# Patient Record
Sex: Female | Born: 1995 | Race: White | Marital: Single
Health system: Southern US, Community
[De-identification: ages and names within clinical notes are randomized; demographics above are authoritative.]

## PROBLEM LIST (undated history)

## (undated) DIAGNOSIS — D709 Neutropenia, unspecified: Secondary | ICD-10-CM

## (undated) DIAGNOSIS — K900 Celiac disease: Secondary | ICD-10-CM

## (undated) HISTORY — PX: NO PAST SURGERIES: SHX2092

## (undated) HISTORY — DX: Celiac disease: K90.0

---
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
## 2017-03-14 DIAGNOSIS — E860 Dehydration: Secondary | ICD-10-CM | POA: Diagnosis not present

## 2017-03-14 DIAGNOSIS — R55 Syncope and collapse: Secondary | ICD-10-CM | POA: Diagnosis not present

## 2017-03-14 DIAGNOSIS — I959 Hypotension, unspecified: Secondary | ICD-10-CM | POA: Diagnosis not present

## 2017-03-14 DIAGNOSIS — N946 Dysmenorrhea, unspecified: Secondary | ICD-10-CM | POA: Diagnosis not present

## 2017-03-14 DIAGNOSIS — R001 Bradycardia, unspecified: Secondary | ICD-10-CM | POA: Diagnosis not present

## 2017-03-21 DIAGNOSIS — Z6821 Body mass index (BMI) 21.0-21.9, adult: Secondary | ICD-10-CM | POA: Diagnosis not present

## 2017-03-21 DIAGNOSIS — Z0000 Encounter for general adult medical examination without abnormal findings: Secondary | ICD-10-CM | POA: Diagnosis not present

## 2017-03-21 DIAGNOSIS — Z1389 Encounter for screening for other disorder: Secondary | ICD-10-CM | POA: Diagnosis not present

## 2017-08-02 DIAGNOSIS — D649 Anemia, unspecified: Secondary | ICD-10-CM | POA: Diagnosis not present

## 2017-08-02 DIAGNOSIS — R7309 Other abnormal glucose: Secondary | ICD-10-CM | POA: Diagnosis not present

## 2017-08-02 DIAGNOSIS — D513 Other dietary vitamin B12 deficiency anemia: Secondary | ICD-10-CM | POA: Diagnosis not present

## 2017-08-02 DIAGNOSIS — R55 Syncope and collapse: Secondary | ICD-10-CM | POA: Diagnosis not present

## 2017-08-02 DIAGNOSIS — R5383 Other fatigue: Secondary | ICD-10-CM | POA: Diagnosis not present

## 2017-08-02 DIAGNOSIS — R233 Spontaneous ecchymoses: Secondary | ICD-10-CM | POA: Diagnosis not present

## 2017-08-02 DIAGNOSIS — D72819 Decreased white blood cell count, unspecified: Secondary | ICD-10-CM | POA: Diagnosis not present

## 2017-08-21 DIAGNOSIS — Z713 Dietary counseling and surveillance: Secondary | ICD-10-CM | POA: Diagnosis not present

## 2017-08-30 DIAGNOSIS — D72819 Decreased white blood cell count, unspecified: Secondary | ICD-10-CM | POA: Diagnosis not present

## 2017-09-22 DIAGNOSIS — Z713 Dietary counseling and surveillance: Secondary | ICD-10-CM | POA: Diagnosis not present

## 2017-10-20 DIAGNOSIS — Z713 Dietary counseling and surveillance: Secondary | ICD-10-CM | POA: Diagnosis not present

## 2017-10-24 DIAGNOSIS — H5211 Myopia, right eye: Secondary | ICD-10-CM | POA: Diagnosis not present

## 2017-11-17 DIAGNOSIS — Z713 Dietary counseling and surveillance: Secondary | ICD-10-CM | POA: Diagnosis not present

## 2017-12-22 DIAGNOSIS — Z713 Dietary counseling and surveillance: Secondary | ICD-10-CM | POA: Diagnosis not present

## 2018-01-29 DIAGNOSIS — Z713 Dietary counseling and surveillance: Secondary | ICD-10-CM | POA: Diagnosis not present

## 2018-02-26 DIAGNOSIS — Z713 Dietary counseling and surveillance: Secondary | ICD-10-CM | POA: Diagnosis not present

## 2018-04-06 DIAGNOSIS — Z124 Encounter for screening for malignant neoplasm of cervix: Secondary | ICD-10-CM | POA: Diagnosis not present

## 2018-04-06 DIAGNOSIS — N926 Irregular menstruation, unspecified: Secondary | ICD-10-CM | POA: Diagnosis not present

## 2018-04-06 DIAGNOSIS — Z01419 Encounter for gynecological examination (general) (routine) without abnormal findings: Secondary | ICD-10-CM | POA: Diagnosis not present

## 2018-04-06 DIAGNOSIS — N920 Excessive and frequent menstruation with regular cycle: Secondary | ICD-10-CM | POA: Diagnosis not present

## 2018-04-06 DIAGNOSIS — Z130 Encounter for screening for diseases of the blood and blood-forming organs and certain disorders involving the immune mechanism: Secondary | ICD-10-CM | POA: Diagnosis not present

## 2018-04-09 DIAGNOSIS — Z124 Encounter for screening for malignant neoplasm of cervix: Secondary | ICD-10-CM | POA: Diagnosis not present

## 2018-04-18 DIAGNOSIS — Z713 Dietary counseling and surveillance: Secondary | ICD-10-CM | POA: Diagnosis not present

## 2018-07-12 DIAGNOSIS — D2272 Melanocytic nevi of left lower limb, including hip: Secondary | ICD-10-CM | POA: Diagnosis not present

## 2018-07-12 DIAGNOSIS — D2271 Melanocytic nevi of right lower limb, including hip: Secondary | ICD-10-CM | POA: Diagnosis not present

## 2018-07-12 DIAGNOSIS — L84 Corns and callosities: Secondary | ICD-10-CM | POA: Diagnosis not present

## 2018-08-02 DIAGNOSIS — D2371 Other benign neoplasm of skin of right lower limb, including hip: Secondary | ICD-10-CM | POA: Diagnosis not present

## 2018-08-02 DIAGNOSIS — D2372 Other benign neoplasm of skin of left lower limb, including hip: Secondary | ICD-10-CM | POA: Diagnosis not present

## 2018-08-08 DIAGNOSIS — E7849 Other hyperlipidemia: Secondary | ICD-10-CM | POA: Diagnosis not present

## 2018-08-08 DIAGNOSIS — D649 Anemia, unspecified: Secondary | ICD-10-CM | POA: Diagnosis not present

## 2018-08-08 DIAGNOSIS — Z0000 Encounter for general adult medical examination without abnormal findings: Secondary | ICD-10-CM | POA: Diagnosis not present

## 2018-08-13 DIAGNOSIS — R82998 Other abnormal findings in urine: Secondary | ICD-10-CM | POA: Diagnosis not present

## 2018-08-15 DIAGNOSIS — Z0000 Encounter for general adult medical examination without abnormal findings: Secondary | ICD-10-CM | POA: Diagnosis not present

## 2018-08-21 DIAGNOSIS — D2372 Other benign neoplasm of skin of left lower limb, including hip: Secondary | ICD-10-CM | POA: Diagnosis not present

## 2018-08-21 DIAGNOSIS — D2371 Other benign neoplasm of skin of right lower limb, including hip: Secondary | ICD-10-CM | POA: Diagnosis not present

## 2018-09-04 DIAGNOSIS — D2372 Other benign neoplasm of skin of left lower limb, including hip: Secondary | ICD-10-CM | POA: Diagnosis not present

## 2018-09-04 DIAGNOSIS — D2371 Other benign neoplasm of skin of right lower limb, including hip: Secondary | ICD-10-CM | POA: Diagnosis not present

## 2018-09-12 DIAGNOSIS — D2371 Other benign neoplasm of skin of right lower limb, including hip: Secondary | ICD-10-CM | POA: Diagnosis not present

## 2018-09-12 DIAGNOSIS — D2372 Other benign neoplasm of skin of left lower limb, including hip: Secondary | ICD-10-CM | POA: Diagnosis not present

## 2018-09-26 DIAGNOSIS — D2372 Other benign neoplasm of skin of left lower limb, including hip: Secondary | ICD-10-CM | POA: Diagnosis not present

## 2018-09-26 DIAGNOSIS — D2371 Other benign neoplasm of skin of right lower limb, including hip: Secondary | ICD-10-CM | POA: Diagnosis not present

## 2018-10-10 DIAGNOSIS — D2371 Other benign neoplasm of skin of right lower limb, including hip: Secondary | ICD-10-CM | POA: Diagnosis not present

## 2018-10-10 DIAGNOSIS — D2372 Other benign neoplasm of skin of left lower limb, including hip: Secondary | ICD-10-CM | POA: Diagnosis not present

## 2018-11-16 DIAGNOSIS — Z23 Encounter for immunization: Secondary | ICD-10-CM | POA: Diagnosis not present

## 2019-05-02 DIAGNOSIS — Z23 Encounter for immunization: Secondary | ICD-10-CM | POA: Diagnosis not present

## 2019-05-30 DIAGNOSIS — Z23 Encounter for immunization: Secondary | ICD-10-CM | POA: Diagnosis not present

## 2019-06-06 DIAGNOSIS — Z130 Encounter for screening for diseases of the blood and blood-forming organs and certain disorders involving the immune mechanism: Secondary | ICD-10-CM | POA: Diagnosis not present

## 2019-06-06 DIAGNOSIS — Z1389 Encounter for screening for other disorder: Secondary | ICD-10-CM | POA: Diagnosis not present

## 2019-06-06 DIAGNOSIS — Z01419 Encounter for gynecological examination (general) (routine) without abnormal findings: Secondary | ICD-10-CM | POA: Diagnosis not present

## 2019-06-06 DIAGNOSIS — Z309 Encounter for contraceptive management, unspecified: Secondary | ICD-10-CM | POA: Diagnosis not present

## 2019-06-18 DIAGNOSIS — Z3202 Encounter for pregnancy test, result negative: Secondary | ICD-10-CM | POA: Diagnosis not present

## 2019-06-18 DIAGNOSIS — Z30430 Encounter for insertion of intrauterine contraceptive device: Secondary | ICD-10-CM | POA: Diagnosis not present

## 2019-08-15 DIAGNOSIS — Z30431 Encounter for routine checking of intrauterine contraceptive device: Secondary | ICD-10-CM | POA: Diagnosis not present

## 2019-10-07 DIAGNOSIS — E785 Hyperlipidemia, unspecified: Secondary | ICD-10-CM | POA: Diagnosis not present

## 2019-10-07 DIAGNOSIS — Z0000 Encounter for general adult medical examination without abnormal findings: Secondary | ICD-10-CM | POA: Diagnosis not present

## 2019-12-26 DIAGNOSIS — Z30431 Encounter for routine checking of intrauterine contraceptive device: Secondary | ICD-10-CM | POA: Diagnosis not present

## 2020-01-01 DIAGNOSIS — J029 Acute pharyngitis, unspecified: Secondary | ICD-10-CM | POA: Diagnosis not present

## 2020-01-01 DIAGNOSIS — J069 Acute upper respiratory infection, unspecified: Secondary | ICD-10-CM | POA: Diagnosis not present

## 2020-01-03 ENCOUNTER — Other Ambulatory Visit: Payer: Self-pay

## 2020-01-03 ENCOUNTER — Ambulatory Visit
Admission: EM | Admit: 2020-01-03 | Discharge: 2020-01-03 | Disposition: A | Discharge status: Home / Self Care | Payer: BC Managed Care – PPO | Attending: Emergency Medicine | Admitting: Emergency Medicine

## 2020-01-03 DIAGNOSIS — J069 Acute upper respiratory infection, unspecified: Secondary | ICD-10-CM | POA: Diagnosis not present

## 2020-01-03 MED ORDER — BENZONATATE 100 MG PO CAPS: 100.0000 mg | ORAL_CAPSULE | Freq: Three times a day (TID) | ORAL | 0 refills | Status: DC | PRN

## 2020-01-03 NOTE — ED Provider Notes (Signed)
[facility]   [id number] 01/03/20 Arrival Time: 1923   CC: COVID symptoms  SUBJECTIVE: History from: patient.  Debra Henry is a 24 y.o. female who presented to the urgent care for complaint of sore throat and cough for the past few days was seen in urgent care has a negative strep, Covid.  Denies sick exposure to COVID, strep.  Denies recent travel.  Has tried OTC medication with no relief.  Denies aggravating factors.  Denies previous symptoms in the past.   Denies fever, chills, fatigue, sinus pain, rhinorrhea, sore throat, SOB, wheezing, chest pain, nausea, changes in bowel or bladder habits.     ROS: As per HPI.  All other pertinent ROS negative.     No past medical history on file. No past surgical history on file. No Known Allergies No current facility-administered medications on file prior to encounter.   Current Outpatient Medications on File Prior to Encounter  Medication Sig Dispense Refill  . Clindamycin-Benzoyl Per, Refr, gel      Social History   Socioeconomic History  . Marital status: Single    Spouse name: Not on file  . Number of children: Not on file  . Years of education: Not on file  . Highest education level: Not on file  Occupational History  . Not on file  Tobacco Use  . Smoking status: Never Smoker  . Smokeless tobacco: Not on file  Substance and Sexual Activity  . Alcohol use: Not on file  . Drug use: Not on file  . Sexual activity: Not on file  Other Topics Concern  . Not on file  Social History Narrative  . Not on file   Social Determinants of Health   Financial Resource Strain: Not on file  Food Insecurity: Not on file  Transportation Needs: Not on file  Physical Activity: Not on file  Stress: Not on file  Social Connections: Not on file  Intimate Partner Violence: Not on file   Family History  Problem Relation Age of Onset  . Sudden death Neg Hx   . Hypertension Neg Hx   . Hyperlipidemia Neg Hx   . Heart attack  Neg Hx   . Diabetes Neg Hx     OBJECTIVE:  Vitals:   01/03/20 1940  BP: 109/74  Pulse: 73  Resp: 20  Temp: 98.6 F (37 C)  SpO2: 98%     General appearance: alert; appears fatigued, but nontoxic; speaking in full sentences and tolerating own secretions HEENT: NCAT; Ears: EACs clear, TMs pearly gray; Eyes: PERRL.  EOM grossly intact. Sinuses: nontender; Nose: nares patent without rhinorrhea, Throat: oropharynx clear, tonsils non erythematous or enlarged, uvula midline  Neck: supple without LAD Lungs: unlabored respirations, symmetrical air entry; cough: moderate; no respiratory distress; CTAB Heart: regular rate and rhythm.  Radial pulses 2+ symmetrical bilaterally Skin: warm and dry Psychological: alert and cooperative; normal mood and affect  LABS:  No results found for this or any previous visit (from the past 24 hour(s)).   ASSESSMENT & PLAN:  1. Viral URI with cough     Meds ordered this encounter  Medications  . benzonatate (TESSALON) 100 MG capsule    Sig: Take 1 capsule (100 mg total) by mouth 3 (three) times daily as needed for cough.    Dispense:  30 capsule    Refill:  0    Discharge instructions  Get plenty of rest and push fluids Tessalon Perles prescribed for cough Continue to use Bromfed as prescribed  Continue to use lidocaine mouthwash as prescribed and directed for sore throat Use medications daily for symptom relief Use OTC medications like ibuprofen or tylenol as needed fever or pain Call or go to the ED if you have any new or worsening symptoms such as fever, worsening cough, shortness of breath, chest tightness, chest pain, turning blue, changes in mental status, etc...   Reviewed expectations re: course of current medical issues. Questions answered. Outlined signs and symptoms indicating need for more acute intervention. Patient verbalized understanding. After Visit Summary given.         Durward Parcel, FNP 01/03/20 1959

## 2020-01-03 NOTE — ED Triage Notes (Signed)
Pt presents with sore throat and cough, seen recently [facility] and tested negative for strep and covid

## 2020-01-03 NOTE — Discharge Instructions (Addendum)
Get plenty of rest and push fluids Tessalon Perles prescribed for cough Continue to use Bromfed as prescribed Continue to use lidocaine mouthwash as prescribed and directed for sore throat Use medications daily for symptom relief Use OTC medications like ibuprofen or tylenol as needed fever or pain Call or go to the ED if you have any new or worsening symptoms such as fever, worsening cough, shortness of breath, chest tightness, chest pain, turning blue, changes in mental status, etc..Marland Kitchen

## 2020-01-18 HISTORY — PX: BONE MARROW BIOPSY: SHX199

## 2020-02-28 DIAGNOSIS — Z20822 Contact with and (suspected) exposure to covid-19: Secondary | ICD-10-CM | POA: Diagnosis not present

## 2020-05-14 DIAGNOSIS — R102 Pelvic and perineal pain: Secondary | ICD-10-CM | POA: Diagnosis not present

## 2020-05-14 DIAGNOSIS — Z113 Encounter for screening for infections with a predominantly sexual mode of transmission: Secondary | ICD-10-CM | POA: Diagnosis not present

## 2020-05-14 DIAGNOSIS — R350 Frequency of micturition: Secondary | ICD-10-CM | POA: Diagnosis not present

## 2020-05-15 DIAGNOSIS — R102 Pelvic and perineal pain: Secondary | ICD-10-CM | POA: Diagnosis not present

## 2020-05-30 DIAGNOSIS — Z03818 Encounter for observation for suspected exposure to other biological agents ruled out: Secondary | ICD-10-CM | POA: Diagnosis not present

## 2020-05-30 DIAGNOSIS — Z20822 Contact with and (suspected) exposure to covid-19: Secondary | ICD-10-CM | POA: Diagnosis not present

## 2020-06-09 DIAGNOSIS — Z6822 Body mass index (BMI) 22.0-22.9, adult: Secondary | ICD-10-CM | POA: Diagnosis not present

## 2020-06-09 DIAGNOSIS — Z130 Encounter for screening for diseases of the blood and blood-forming organs and certain disorders involving the immune mechanism: Secondary | ICD-10-CM | POA: Diagnosis not present

## 2020-06-09 DIAGNOSIS — Z01419 Encounter for gynecological examination (general) (routine) without abnormal findings: Secondary | ICD-10-CM | POA: Diagnosis not present

## 2020-06-09 DIAGNOSIS — Z1389 Encounter for screening for other disorder: Secondary | ICD-10-CM | POA: Diagnosis not present

## 2020-06-12 DIAGNOSIS — R1030 Lower abdominal pain, unspecified: Secondary | ICD-10-CM | POA: Diagnosis not present

## 2020-06-12 DIAGNOSIS — R82998 Other abnormal findings in urine: Secondary | ICD-10-CM | POA: Diagnosis not present

## 2020-06-12 DIAGNOSIS — Z0000 Encounter for general adult medical examination without abnormal findings: Secondary | ICD-10-CM | POA: Diagnosis not present

## 2020-06-19 DIAGNOSIS — E785 Hyperlipidemia, unspecified: Secondary | ICD-10-CM | POA: Diagnosis not present

## 2020-06-24 ENCOUNTER — Telehealth: Payer: Self-pay | Admitting: Hematology and Oncology

## 2020-06-24 NOTE — Telephone Encounter (Signed)
Scheduled appt per 6/7 referral. Pt aware.  

## 2020-07-02 ENCOUNTER — Encounter: Payer: Self-pay | Admitting: Hematology and Oncology

## 2020-07-02 ENCOUNTER — Inpatient Hospital Stay: Payer: BC Managed Care – PPO | Attending: Hematology and Oncology | Admitting: Hematology and Oncology

## 2020-07-02 ENCOUNTER — Telehealth: Payer: Self-pay | Admitting: *Deleted

## 2020-07-02 ENCOUNTER — Inpatient Hospital Stay: Payer: BC Managed Care – PPO

## 2020-07-02 ENCOUNTER — Other Ambulatory Visit: Payer: Self-pay

## 2020-07-02 VITALS — BP 106/72 | HR 63 | Temp 97.3°F | Resp 17 | Ht 64.0 in | Wt 139.6 lb

## 2020-07-02 DIAGNOSIS — D709 Neutropenia, unspecified: Secondary | ICD-10-CM

## 2020-07-02 DIAGNOSIS — D72819 Decreased white blood cell count, unspecified: Secondary | ICD-10-CM | POA: Diagnosis not present

## 2020-07-02 DIAGNOSIS — Z8042 Family history of malignant neoplasm of prostate: Secondary | ICD-10-CM | POA: Insufficient documentation

## 2020-07-02 DIAGNOSIS — Z79899 Other long term (current) drug therapy: Secondary | ICD-10-CM | POA: Diagnosis not present

## 2020-07-02 DIAGNOSIS — Z803 Family history of malignant neoplasm of breast: Secondary | ICD-10-CM | POA: Insufficient documentation

## 2020-07-02 LAB — HEPATITIS C ANTIBODY: HCV Ab: NONREACTIVE

## 2020-07-02 LAB — CBC WITH DIFFERENTIAL (CANCER CENTER ONLY)
Abs Immature Granulocytes: 0 10*3/uL (ref 0.00–0.07)
Basophils Absolute: 0 10*3/uL (ref 0.0–0.1)
Basophils Relative: 1 %
Eosinophils Absolute: 0 10*3/uL (ref 0.0–0.5)
Eosinophils Relative: 2 %
HCT: 39.2 % (ref 36.0–46.0)
Hemoglobin: 13.3 g/dL (ref 12.0–15.0)
Immature Granulocytes: 0 %
Lymphocytes Relative: 74 %
Lymphs Abs: 1.5 10*3/uL (ref 0.7–4.0)
MCH: 29.6 pg (ref 26.0–34.0)
MCHC: 33.9 g/dL (ref 30.0–36.0)
MCV: 87.1 fL (ref 80.0–100.0)
Monocytes Absolute: 0.4 10*3/uL (ref 0.1–1.0)
Monocytes Relative: 18 %
Neutro Abs: 0.1 10*3/uL — CL (ref 1.7–7.7)
Neutrophils Relative %: 5 %
Platelet Count: 253 10*3/uL (ref 150–400)
RBC: 4.5 MIL/uL (ref 3.87–5.11)
RDW: 12.6 % (ref 11.5–15.5)
WBC Count: 2 10*3/uL — ABNORMAL LOW (ref 4.0–10.5)
nRBC: 0 % (ref 0.0–0.2)

## 2020-07-02 LAB — CMP (CANCER CENTER ONLY)
ALT: 8 U/L (ref 0–44)
AST: 17 U/L (ref 15–41)
Albumin: 4.6 g/dL (ref 3.5–5.0)
Alkaline Phosphatase: 87 U/L (ref 38–126)
Anion gap: 7 (ref 5–15)
BUN: 18 mg/dL (ref 6–20)
CO2: 24 mmol/L (ref 22–32)
Calcium: 9.7 mg/dL (ref 8.9–10.3)
Chloride: 106 mmol/L (ref 98–111)
Creatinine: 0.93 mg/dL (ref 0.44–1.00)
GFR, Estimated: >60 mL/min (ref >60)
Glucose, Bld: 83 mg/dL (ref 70–99)
Potassium: 4.1 mmol/L (ref 3.5–5.1)
Sodium: 137 mmol/L (ref 135–145)
Total Bilirubin: 0.7 mg/dL (ref 0.3–1.2)
Total Protein: 7.5 g/dL (ref 6.5–8.1)

## 2020-07-02 LAB — HEPATITIS B SURFACE ANTIBODY,QUALITATIVE: Hep B S Ab: REACTIVE — AB

## 2020-07-02 LAB — VITAMIN B12: Vitamin B-12: 207 pg/mL (ref 180–914)

## 2020-07-02 LAB — LACTATE DEHYDROGENASE: LDH: 146 U/L (ref 98–192)

## 2020-07-02 LAB — FOLATE: Folate: 10.7 ng/mL (ref >5.9)

## 2020-07-02 LAB — SAVE SMEAR(SSMR), FOR PROVIDER SLIDE REVIEW

## 2020-07-02 LAB — HIV ANTIBODY (ROUTINE TESTING W REFLEX): HIV Screen 4th Generation wRfx: NONREACTIVE

## 2020-07-02 LAB — HEPATITIS B SURFACE ANTIGEN: Hepatitis B Surface Ag: NONREACTIVE

## 2020-07-02 LAB — HEPATITIS B CORE ANTIBODY, TOTAL: Hep B Core Total Ab: NONREACTIVE

## 2020-07-02 NOTE — Progress Notes (Signed)
[place] Telephone:([phone]) [phone]   Fax:([phone]) [place] NOTE  Patient Care Team: Pa, [facility] as PCP - General  Hematological/Oncological History # Leukopenia 10/07/2019: WBC 3.6, Hgb 12.4, MCV 84.9, Plt 246. ANC 1.7 06/12/2020: WBC 2.3, Hgb 13.4, MCV 90.2, Plt 250, ANC 0.2 06/19/2020: WBC 2.14, Hgb 13.6, MCV 89.3, Plt 237, ANC 0.2 07/02/2020: establish care with Dr. Lorenso Courier   CHIEF COMPLAINTS/PURPOSE OF CONSULTATION:  "Leukopenia "  HISTORY OF PRESENTING ILLNESS:  Debra Henry 25 y.o. female with no significant past medical history who presents for evaluation of leukopenia.   On review of the previous records Debra Henry has had a severe neutropenia since at least 06/12/2020.  At that time showed a white blood cell count of 2.3 with an ANC of 0.2.  Her hemoglobin MCV and platelets were all normal at that time.  On repeat check on 06/19/2020 the patient similarly had an ANC of 0.2.  Her last prior normal CBC was on 10/07/2019 which showed a white blood cell count 3.6 with an ANC of 1.7.  Due to concern for this patient's severe neutropenia she was referred to hematology for further evaluation and management.  On exam today Debra Henry is accompanied by her mother.  She reports that she had 2 UTIs "back-to-back" right before her graduation on May 14.  She notes that she has not been having any other clear signs of infection such as sinus infections, pneumonias, rashes, or nausea/vomiting/diarrhea.  She reports that she has had some stomach discomfort and pain that she believed was OB/GYN related.  Her OB/GYN thought this may be secondary to constipation, though she reports the pains are very similar to what she experienced when her IUD was placed last summer.  She reports that she is not taking any medications by mouth or supplements at this time.  She is otherwise quite healthy.  On further discussion she reports that she has never had any  surgeries performed.  Her mom and dad are both healthy though she does have a family history of breast cancer in her maternal grandmother and cancer in the shoulder of her maternal grandfather.  Her maternal uncle also had prostate cancer and she had a cousin with lymphoma.  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  She notes that her energy is good and she has excellent exercise tolerance.  She skips meals frequently but when she does eat has no restrictions and only tends to restrict calories.  A full 10 point ROS is listed below.  MEDICAL HISTORY:  History reviewed. No pertinent past medical history.  SURGICAL HISTORY: History reviewed. No pertinent surgical history.  SOCIAL HISTORY: Social History   Socioeconomic History   Marital status: Single    Spouse name: Not on file   Number of children: 0   Years of education: Not on file   Highest education level: Not on file  Occupational History   Not on file  Tobacco Use   Smoking status: Never   Smokeless tobacco: Never  Vaping Use   Vaping Use: Never used  Substance and Sexual Activity   Alcohol use: Never   Drug use: Not on file   Sexual activity: Not on file  Other Topics Concern   Not on file  Social History Narrative   Not on file   Social Determinants of Health   Financial Resource Strain: Not on file  Food Insecurity: Not on file  Transportation Needs: Not on file  Physical Activity: Not on file  Stress: Not on file  Social Connections: Not on file  Intimate Partner Violence: Not on file    FAMILY HISTORY: Family History  Problem Relation Age of Onset   Healthy Mother    Healthy Father    Prostate cancer Paternal Uncle    Breast cancer Paternal Grandmother    Sudden death Neg Hx    Hypertension Neg Hx    Hyperlipidemia Neg Hx    Heart attack Neg Hx    Diabetes Neg Hx     ALLERGIES:  has No Known Allergies.  MEDICATIONS:  Current Outpatient Medications  Medication  Sig Dispense Refill   benzonatate (TESSALON) 100 MG capsule Take 1 capsule (100 mg total) by mouth 3 (three) times daily as needed for cough. (Patient not taking: Reported on 07/02/2020) 30 capsule 0   Clindamycin-Benzoyl Per, Refr, gel  (Patient not taking: Reported on 07/02/2020)     levonorgestrel (KYLEENA) 19.5 MG IUD Kyleena 17.5 mcg/24 hrs (41yr) 19.566mintrauterine device  Take 1 device by intrauterine route.     No current facility-administered medications for this visit.    REVIEW OF SYSTEMS:   Constitutional: ( - ) fevers, ( - )  chills , ( - ) night sweats Eyes: ( - ) blurriness of vision, ( - ) double vision, ( - ) watery eyes Ears, nose, mouth, throat, and face: ( - ) mucositis, ( - ) sore throat Respiratory: ( - ) cough, ( - ) dyspnea, ( - ) wheezes Cardiovascular: ( - ) palpitation, ( - ) chest discomfort, ( - ) lower extremity swelling Gastrointestinal:  ( - ) nausea, ( - ) heartburn, ( - ) change in bowel habits Skin: ( - ) abnormal skin rashes Lymphatics: ( - ) new lymphadenopathy, ( - ) easy bruising Neurological: ( - ) numbness, ( - ) tingling, ( - ) new weaknesses Behavioral/Psych: ( - ) mood change, ( - ) new changes  All other systems were reviewed with the patient and are negative.  PHYSICAL EXAMINATION:  Vitals:   07/02/20 0911  BP: 106/72  Pulse: 63  Resp: 17  Temp: (!) 97.3 F (36.3 C)  SpO2: 99%   Filed Weights   07/02/20 0911  Weight: 139 lb 9.6 oz (63.3 kg)    GENERAL: well appearing young Caucasian female in NAD  SKIN: skin color, texture, turgor are normal, no rashes or significant lesions EYES: conjunctiva are pink and non-injected, sclera clear OROPHARYNX: no exudate, no erythema; lips, buccal mucosa, and tongue normal  NECK: supple, non-tender LYMPH:  no palpable lymphadenopathy in the cervical, axillary or supraclavicular lymph nodes.  LUNGS: clear to auscultation and percussion with normal breathing effort HEART: regular rate & rhythm  and no murmurs and no lower extremity edema ABDOMEN: soft, non-tender, non-distended, normal bowel sounds Musculoskeletal: no cyanosis of digits and no clubbing. No HSM appreciated.   PSYCH: alert & oriented x 3, fluent speech NEURO: no focal motor/sensory deficits  LABORATORY DATA:  I have reviewed the data as listed CBC Latest Ref Rng & Units 07/02/2020  WBC 4.0 - 10.5 K/uL 2.0(L)  Hemoglobin 12.0 - 15.0 g/dL 13.3  Hematocrit 36.0 - 46.0 % 39.2  Platelets 150 - 400 K/uL 253    CMP Latest Ref Rng & Units 07/02/2020  Glucose 70 - 99 mg/dL 83  BUN 6 - 20 mg/dL 18  Creatinine 0.44 - 1.00 mg/dL 0.93  Sodium 135 - 145  mmol/L 137  Potassium 3.5 - 5.1 mmol/L 4.1  Chloride 98 - 111 mmol/L 106  CO2 22 - 32 mmol/L 24  Calcium 8.9 - 10.3 mg/dL 9.7  Total Protein 6.5 - 8.1 g/dL 7.5  Total Bilirubin 0.3 - 1.2 mg/dL 0.7  Alkaline Phos 38 - 126 U/L 87  AST 15 - 41 U/L 17  ALT 0 - 44 U/L 8     RADIOGRAPHIC STUDIES:  No results found.  ASSESSMENT & PLAN Kiyanna Biegler 25 y.o. female with no significant past medical history who presents for evaluation of leukopenia.   After review of the labs, review of the records, and discussion with the patient the patients findings are most consistent with a severe neutropenia of unclear etiology.  The patient has no focal symptoms which would lead [place] towards a possible cause.  Also the patient has had perfectly normal hemoglobin, platelet count, and MCV throughout this whole process.  She is virtually asymptomatic with exception of 2 urinary tract infections earlier this year.  At this time we will do a broad work-up to include viral studies, nutritional studies, and review of the peripheral blood film.  If no clear etiology can be discerned we will need to consider bone marrow biopsy in order to further evaluate.  # Leukopenia # Severe Neutropenia -- Viral work-up with HIV, hepatitis B, hepatitis C --Nutritional work-up with vitamin B12, folate,  homocystine, and methylmalonic acid. --We will also order baseline CBC and CMP.  We will order LDH and review of peripheral blood film --If no clear etiology can be discerned we will need to pursue a bone marrow biopsy. --Return to clinic pending the results of the above studies.  Orders Placed This Encounter  Procedures   CBC with Differential ([facility] Only)    Standing Status:   Future    Number of Occurrences:   1    Standing Expiration Date:   07/02/2021   CMP (Cancer Center only)    Standing Status:   Future    Number of Occurrences:   1    Standing Expiration Date:   07/02/2021   Lactate dehydrogenase (LDH)    Standing Status:   Future    Number of Occurrences:   1    Standing Expiration Date:   07/02/2021   Vitamin B12    Standing Status:   Future    Number of Occurrences:   1    Standing Expiration Date:   07/02/2021   Folate, Serum    Standing Status:   Future    Number of Occurrences:   1    Standing Expiration Date:   07/02/2021   Methylmalonic acid, serum    Standing Status:   Future    Number of Occurrences:   1    Standing Expiration Date:   07/02/2021   Homocysteine, serum    Standing Status:   Future    Number of Occurrences:   1    Standing Expiration Date:   07/02/2021   Save Smear (SSMR)    Standing Status:   Future    Number of Occurrences:   1    Standing Expiration Date:   07/02/2021   Hepatitis B surface antigen    Standing Status:   Future    Number of Occurrences:   1    Standing Expiration Date:   07/02/2021   Hepatitis C antibody    Standing Status:   Future    Number of Occurrences:   1  Standing Expiration Date:   07/02/2021   HIV antibody (with reflex)    Standing Status:   Future    Number of Occurrences:   1    Standing Expiration Date:   07/02/2021   Hepatitis B core antibody, total    Standing Status:   Future    Number of Occurrences:   1    Standing Expiration Date:   07/02/2021   Hepatitis B surface antibody    Standing Status:    Future    Number of Occurrences:   1    Standing Expiration Date:   07/02/2021    All questions were answered. The patient knows to call the clinic with any problems, questions or concerns.  A total of more than 60 minutes were spent on this encounter with face-to-face time and non-face-to-face time, including preparing to see the patient, ordering tests and/or medications, counseling the patient and coordination of care as outlined above.   Ledell Peoples, MD Department of Hematology/Oncology [facility] Phone: [phone] Pager: [phone] Email: [email or web address]  07/02/2020 11:44 AM

## 2020-07-02 NOTE — Telephone Encounter (Signed)
CRITICAL VALUE STICKER  CRITICAL VALUE: ANC 0.1  RECEIVER (on-site recipient of call):Sandi K, RN  DATE & TIME NOTIFIED: 07/02/20;1119  MESSENGER (representative from lab):Jay  MD NOTIFIED: Dr. [name] and MD RN  TIME OF NOTIFICATION:1130  RESPONSE: Information acknowledged

## 2020-07-02 NOTE — Telephone Encounter (Signed)
Contacted patient at Dr. Derek Mound request with following instructions: Neutrophil count is down to 0.1 (from 0.2) today. Dr. [name] strongly advises against going on planned trip to [facility] for wedding due to chance of infection. Patient is advised to contact provider if temp 100.6 or higher.   Patient verbalized understanding of all information.

## 2020-07-03 ENCOUNTER — Other Ambulatory Visit: Payer: Self-pay | Admitting: Hematology and Oncology

## 2020-07-03 DIAGNOSIS — D709 Neutropenia, unspecified: Secondary | ICD-10-CM

## 2020-07-03 LAB — HOMOCYSTEINE: Homocysteine: 11.7 umol/L (ref 0.0–14.5)

## 2020-07-05 LAB — METHYLMALONIC ACID, SERUM: Methylmalonic Acid, Quantitative: 159 nmol/L (ref 0–378)

## 2020-07-06 ENCOUNTER — Telehealth: Payer: Self-pay | Admitting: Hematology and Oncology

## 2020-07-06 ENCOUNTER — Other Ambulatory Visit: Payer: Self-pay | Admitting: Hematology and Oncology

## 2020-07-06 ENCOUNTER — Telehealth: Payer: Self-pay | Admitting: Adult Health

## 2020-07-06 ENCOUNTER — Telehealth (HOSPITAL_COMMUNITY): Payer: Self-pay

## 2020-07-06 ENCOUNTER — Telehealth: Payer: Self-pay | Admitting: *Deleted

## 2020-07-06 MED ORDER — LORAZEPAM 1 MG PO TABS: 1.0000 mg | ORAL_TABLET | ORAL | 0 refills | Status: DC

## 2020-07-06 NOTE — Telephone Encounter (Signed)
Scheduled appt per 6/20 sch msg. Pt aware.  

## 2020-07-06 NOTE — Telephone Encounter (Signed)
Received call from Dr. Lorenso Courier RN to get patient scheduled for bone marrow biopsy.  Earliest appt is 7/24 at 0730.  High priority schedule message sent. Flow notified.    Wilber Bihari, NP

## 2020-07-06 NOTE — Telephone Encounter (Signed)
Several calls made today regarding pt's bone marrow biopsy. Dr. Lorenso Courier is asking to have it done ASAP. The earliest it can be done in Radiology is 07/13/20.It can not be done any sooner at [facility] Either. Decision made for pt to have bone marrow biopsy done here at the cancer center by Manfred Arch, NP on Friday, 07/10/20 @ 7:30am  Dr. Lorenso Courier to send in prescription for Lorazepam for pt to take prior to procedure. Pt and her mother, Debra Henry aware of the above.  They are concerned about getting results back back before a planned family trip to [place], schedule to begin on 07/15/20. The plan is to be back by 07/28/20   Dr. Lorenso Courier aware of this.

## 2020-07-07 ENCOUNTER — Other Ambulatory Visit: Payer: Self-pay | Admitting: Radiology

## 2020-07-07 NOTE — H&P (Signed)
Chief Complaint: Patient was seen in consultation today for image guided bone marrow biopsy and aspiration at the request of [facility] T IV  Referring Physician(s): Dorsey,John T IV  Supervising Physician: Sandi Mariscal  Patient Status: [facility] - Out-pt  History of Present Illness: Debra Henry is a 25 y.o. female with chronic leukopenia with unclear etiology.  Patient has had leukopenia with severe neutropenia since September 2021, she was referred to hem/onc for further evaluation. Patient underwent serology work up which revealed no clear etiology for leukopenia. A bone marrow biopsy ans aspiration was recommended to the patient for further evaluation, after thorough discussion and shared decision making, patient decided to proceed with the biopsy.   IR was requested for a image guided bone marrow biopsy and aspiration.   Patient laying in bed, not in acute distress.  Denise headache, fever, chills, shortness of breath, cough, chest pain, abdominal pain, nausea ,vomiting, and bleeding.    History reviewed. No pertinent past medical history.  History reviewed. No pertinent surgical history.  Allergies: Patient has no known allergies.  Medications: Prior to Admission medications   Medication Sig Start Date End Date Taking? Authorizing Provider  benzonatate (TESSALON) 100 MG capsule Take 1 capsule (100 mg total) by mouth 3 (three) times daily as needed for cough. Patient not taking: Reported on 07/02/2020 01/03/20   Emerson Monte, FNP  Clindamycin-Benzoyl Per, Refr, gel  08/31/12   [provider]  levonorgestrel Verdia Kuba) 19.5 MG IUD Kyleena 17.5 mcg/24 hrs (41yr) 19.574mintrauterine device  Take 1 device by intrauterine route.    [provider]  LORazepam (ATIVAN) 1 MG tablet Take 1 tablet (1 mg total) by mouth as directed. Take 1 pill of ativan approximately 1-2 hours before bone marrow biopsy. 07/06/20   DoOrson SlickMD     Family History   Problem Relation Age of Onset   Healthy Mother    Healthy Father    Prostate cancer Paternal Uncle    Breast cancer Paternal Grandmother    Sudden death Neg Hx    Hypertension Neg Hx    Hyperlipidemia Neg Hx    Heart attack Neg Hx    Diabetes Neg Hx     Social History   Socioeconomic History   Marital status: Single    Spouse name: Not on file   Number of children: 0   Years of education: Not on file   Highest education level: Not on file  Occupational History   Not on file  Tobacco Use   Smoking status: Never   Smokeless tobacco: Never  Vaping Use   Vaping Use: Never used  Substance and Sexual Activity   Alcohol use: Never   Drug use: Not on file   Sexual activity: Not on file  Other Topics Concern   Not on file  Social History Narrative   Not on file   Social Determinants of Health   Financial Resource Strain: Not on file  Food Insecurity: Not on file  Transportation Needs: Not on file  Physical Activity: Not on file  Stress: Not on file  Social Connections: Not on file     Review of Systems: A 12 point ROS discussed and pertinent positives are indicated in the HPI above.  All other systems are negative.   Vital Signs: BP 123/75   Pulse 86   Temp 98.5 F (36.9 C) (Oral)   Resp 16   LMP  (LMP Unknown) Comment: IUD  SpO2 98%  Physical Exam  Vitals and nursing note reviewed.  Constitutional:      General: He is not in acute distress.    Appearance: Normal appearance.  HENT:     Head: Normocephalic and atraumatic.     Mouth/Throat:     Mouth: Mucous membranes are moist.     Pharynx: Oropharynx is clear.  Cardiovascular:     Rate and Rhythm: Normal rate and regular rhythm.     Pulses: Normal pulses.     Heart sounds: Normal heart sounds.  Pulmonary:     Effort: Pulmonary effort is normal.     Breath sounds: Normal breath sounds. No wheezing, rhonchi or rales.  Abdominal:     General: Bowel sounds are normal. There is no distension.      Palpations: Abdomen is soft.  Skin:    General: Skin is warm and dry.  Neurological:     Mental Status: He is alert and oriented to person, place, and time.  Psychiatric:        Mood and Affect: Mood normal.        Behavior: Behavior normal.    MD Evaluation Airway: WNL Heart: WNL Abdomen: WNL Chest/ Lungs: WNL ASA  Classification: 2 Mallampati/Airway Score: One  Imaging: No results found.  Labs:  CBC: Recent Labs    07/02/20 1020 07/08/20 0746  WBC 2.0* 2.4*  HGB 13.3 12.3  HCT 39.2 37.3  PLT 253 231    COAGS: No results for input(s): INR, APTT in the last 8760 hours.  BMP: Recent Labs    07/02/20 1020  NA 137  K 4.1  CL 106  CO2 24  GLUCOSE 83  BUN 18  CALCIUM 9.7  CREATININE 0.93  GFRNONAA >60    LIVER FUNCTION TESTS: Recent Labs    07/02/20 1020  BILITOT 0.7  AST 17  ALT 8  ALKPHOS 87  PROT 7.5  ALBUMIN 4.6    TUMOR MARKERS: No results for input(s): AFPTM, CEA, CA199, CHROMGRNA in the last 8760 hours.  Assessment and Plan: 25 y.o. female with chronic leukopenia with unclear etiology. Patient was referred to hem/onc for further evaluation. Patient underwent serology work up which revealed no clear etiology for leukopenia. A bone marrow biopsy ans aspiration was recommended to the patient for further evaluation, after thorough discussion and shared decision making, patient decided to proceed with the biopsy.   IR was requested for a image guided bone marrow biopsy and aspiration.  Patient presents to [facility] IR for the procedure.  NPO since midnight  VSS CBC  WBC 2.4    Risks and benefits of bone marrow biopsy and aspiration was discussed with the patient and/or patient's family including, but not limited to bleeding, infection, damage to adjacent structures or low yield requiring additional tests.  All of the questions were answered and there is agreement to proceed.  Consent signed and in chart.   Thank you for this interesting  consult.  I greatly enjoyed meeting Debra Henry and look forward to participating in their care.  A copy of this report was sent to the requesting provider on this date.  Electronically Signed: Tera Mater, PA-C 07/08/2020, 8:13 AM   I spent a total of  30 Minutes  in face to face in clinical consultation, greater than 50% of which was counseling/coordinating care for bone marrow biopsy and aspiration.

## 2020-07-08 ENCOUNTER — Ambulatory Visit (HOSPITAL_COMMUNITY)
Admission: RE | Admit: 2020-07-08 | Discharge: 2020-07-08 | Disposition: A | Discharge status: Home / Self Care | Payer: BC Managed Care – PPO | Source: Ambulatory Visit | Attending: Hematology and Oncology | Admitting: Hematology and Oncology

## 2020-07-08 ENCOUNTER — Encounter (HOSPITAL_COMMUNITY): Payer: Self-pay

## 2020-07-08 ENCOUNTER — Other Ambulatory Visit: Payer: Self-pay

## 2020-07-08 DIAGNOSIS — D709 Neutropenia, unspecified: Secondary | ICD-10-CM | POA: Diagnosis not present

## 2020-07-08 DIAGNOSIS — D72819 Decreased white blood cell count, unspecified: Secondary | ICD-10-CM | POA: Diagnosis not present

## 2020-07-08 LAB — CBC WITH DIFFERENTIAL/PLATELET
Abs Immature Granulocytes: 0 10*3/uL (ref 0.00–0.07)
Basophils Absolute: 0 10*3/uL (ref 0.0–0.1)
Basophils Relative: 0 %
Eosinophils Absolute: 0.1 10*3/uL (ref 0.0–0.5)
Eosinophils Relative: 3 %
HCT: 37.3 % (ref 36.0–46.0)
Hemoglobin: 12.3 g/dL (ref 12.0–15.0)
Immature Granulocytes: 0 %
Lymphocytes Relative: 71 %
Lymphs Abs: 1.7 10*3/uL (ref 0.7–4.0)
MCH: 29.2 pg (ref 26.0–34.0)
MCHC: 33 g/dL (ref 30.0–36.0)
MCV: 88.6 fL (ref 80.0–100.0)
Monocytes Absolute: 0.3 10*3/uL (ref 0.1–1.0)
Monocytes Relative: 14 %
Neutro Abs: 0.3 10*3/uL — CL (ref 1.7–7.7)
Neutrophils Relative %: 12 %
Platelets: 231 10*3/uL (ref 150–400)
RBC: 4.21 MIL/uL (ref 3.87–5.11)
RDW: 12.5 % (ref 11.5–15.5)
WBC: 2.4 10*3/uL — ABNORMAL LOW (ref 4.0–10.5)
nRBC: 0 % (ref 0.0–0.2)

## 2020-07-08 MED ORDER — LIDOCAINE HCL 1 % IJ SOLN
INTRAMUSCULAR | Status: AC | PRN
  Administered 2020-07-08: 20 mL

## 2020-07-08 MED ORDER — FENTANYL CITRATE (PF) 100 MCG/2ML IJ SOLN
INTRAMUSCULAR | Status: AC
  Filled 2020-07-08: qty 2

## 2020-07-08 MED ORDER — FENTANYL CITRATE (PF) 100 MCG/2ML IJ SOLN
INTRAMUSCULAR | Status: AC | PRN
  Administered 2020-07-08 (×2): 50 ug via INTRAVENOUS

## 2020-07-08 MED ORDER — SODIUM CHLORIDE 0.9 % IV SOLN: INTRAVENOUS | Status: DC

## 2020-07-08 MED ORDER — MIDAZOLAM HCL 2 MG/2ML IJ SOLN
INTRAMUSCULAR | Status: AC | PRN
  Administered 2020-07-08 (×2): 1 mg via INTRAVENOUS
  Administered 2020-07-08: 2 mg via INTRAVENOUS

## 2020-07-08 MED ORDER — DIPHENHYDRAMINE HCL 50 MG/ML IJ SOLN
INTRAMUSCULAR | Status: AC
  Filled 2020-07-08: qty 1

## 2020-07-08 MED ORDER — DIPHENHYDRAMINE HCL 50 MG/ML IJ SOLN
INTRAMUSCULAR | Status: AC | PRN
  Administered 2020-07-08: 25 mg via INTRAVENOUS

## 2020-07-08 MED ORDER — MIDAZOLAM HCL 2 MG/2ML IJ SOLN
INTRAMUSCULAR | Status: AC
  Filled 2020-07-08: qty 4

## 2020-07-08 NOTE — Discharge Instructions (Signed)

## 2020-07-08 NOTE — Procedures (Signed)
Pre-procedure Diagnosis: Neutropenia Post-procedure Diagnosis: Same  Technically successful CT guided bone marrow aspiration and biopsy of left iliac crest.   Complications: None Immediate  EBL: None  Signed: Simonne Come Pager: [phone] 07/08/2020, 9:18 AM

## 2020-07-09 ENCOUNTER — Telehealth: Payer: Self-pay | Admitting: Hematology and Oncology

## 2020-07-09 ENCOUNTER — Other Ambulatory Visit: Payer: Self-pay | Admitting: Hematology and Oncology

## 2020-07-09 LAB — SURGICAL PATHOLOGY

## 2020-07-09 MED ORDER — PREDNISONE 20 MG PO TABS: 60.0000 mg | ORAL_TABLET | Freq: Every day | ORAL | 0 refills | Status: DC

## 2020-07-09 NOTE — Telephone Encounter (Signed)
Per 6/23 sch msg, pt aware

## 2020-07-10 ENCOUNTER — Other Ambulatory Visit: Payer: BC Managed Care – PPO

## 2020-07-13 ENCOUNTER — Inpatient Hospital Stay: Payer: BC Managed Care – PPO

## 2020-07-13 ENCOUNTER — Other Ambulatory Visit: Payer: Self-pay | Admitting: Hematology and Oncology

## 2020-07-13 ENCOUNTER — Ambulatory Visit (HOSPITAL_COMMUNITY): Payer: BC Managed Care – PPO

## 2020-07-13 ENCOUNTER — Telehealth: Payer: Self-pay | Admitting: Hematology and Oncology

## 2020-07-13 ENCOUNTER — Other Ambulatory Visit: Payer: Self-pay

## 2020-07-13 DIAGNOSIS — Z79899 Other long term (current) drug therapy: Secondary | ICD-10-CM | POA: Diagnosis not present

## 2020-07-13 DIAGNOSIS — D72819 Decreased white blood cell count, unspecified: Secondary | ICD-10-CM | POA: Diagnosis not present

## 2020-07-13 DIAGNOSIS — D709 Neutropenia, unspecified: Secondary | ICD-10-CM

## 2020-07-13 DIAGNOSIS — Z803 Family history of malignant neoplasm of breast: Secondary | ICD-10-CM | POA: Diagnosis not present

## 2020-07-13 DIAGNOSIS — Z8042 Family history of malignant neoplasm of prostate: Secondary | ICD-10-CM | POA: Diagnosis not present

## 2020-07-13 LAB — CMP (CANCER CENTER ONLY)
ALT: 10 U/L (ref 0–44)
AST: 13 U/L — ABNORMAL LOW (ref 15–41)
Albumin: 3.9 g/dL (ref 3.5–5.0)
Alkaline Phosphatase: 79 U/L (ref 38–126)
Anion gap: 10 (ref 5–15)
BUN: 15 mg/dL (ref 6–20)
CO2: 24 mmol/L (ref 22–32)
Calcium: 9.1 mg/dL (ref 8.9–10.3)
Chloride: 107 mmol/L (ref 98–111)
Creatinine: 0.83 mg/dL (ref 0.44–1.00)
GFR, Estimated: >60 mL/min (ref >60)
Glucose, Bld: 121 mg/dL — ABNORMAL HIGH (ref 70–99)
Potassium: 4.3 mmol/L (ref 3.5–5.1)
Sodium: 141 mmol/L (ref 135–145)
Total Bilirubin: 0.4 mg/dL (ref 0.3–1.2)
Total Protein: 6.8 g/dL (ref 6.5–8.1)

## 2020-07-13 LAB — CBC WITH DIFFERENTIAL (CANCER CENTER ONLY)
Abs Immature Granulocytes: 0.01 10*3/uL (ref 0.00–0.07)
Basophils Absolute: 0 10*3/uL (ref 0.0–0.1)
Basophils Relative: 0 %
Eosinophils Absolute: 0 10*3/uL (ref 0.0–0.5)
Eosinophils Relative: 0 %
HCT: 37.5 % (ref 36.0–46.0)
Hemoglobin: 12.7 g/dL (ref 12.0–15.0)
Immature Granulocytes: 0 %
Lymphocytes Relative: 22 %
Lymphs Abs: 0.7 10*3/uL (ref 0.7–4.0)
MCH: 29.7 pg (ref 26.0–34.0)
MCHC: 33.9 g/dL (ref 30.0–36.0)
MCV: 87.8 fL (ref 80.0–100.0)
Monocytes Absolute: 0.1 10*3/uL (ref 0.1–1.0)
Monocytes Relative: 3 %
Neutro Abs: 2.3 10*3/uL (ref 1.7–7.7)
Neutrophils Relative %: 75 %
Platelet Count: 246 10*3/uL (ref 150–400)
RBC: 4.27 MIL/uL (ref 3.87–5.11)
RDW: 12.6 % (ref 11.5–15.5)
WBC Count: 3.1 10*3/uL — ABNORMAL LOW (ref 4.0–10.5)
nRBC: 0 % (ref 0.0–0.2)

## 2020-07-13 LAB — SAVE SMEAR(SSMR), FOR PROVIDER SLIDE REVIEW

## 2020-07-13 NOTE — Telephone Encounter (Signed)
Scheduled appt per 6/27 sch msg. Pt aware.  

## 2020-07-13 NOTE — Telephone Encounter (Signed)
Called Ms. Blodgett to discuss the results of her blood work.  ANC increased to 2.3 with steroid therapy over the weekend.  Findings are reassuring and therefore I believe is appropriate for her to go on her trip to [place] with strict precautions to avoid crowds, wear a mask, and keep a thermometer with her in the event she were to develop a fever.  We will plan to see her when she returns from [place] sometime after 07/28/2020.  Ulysees Barns, MD Department of Hematology/Oncology [facility] at [facility] Phone: [phone] Pager: [phone] Email: [email or web address]

## 2020-07-16 ENCOUNTER — Encounter (HOSPITAL_COMMUNITY): Payer: Self-pay | Admitting: Hematology and Oncology

## 2020-07-30 ENCOUNTER — Other Ambulatory Visit: Payer: Self-pay | Admitting: Hematology and Oncology

## 2020-07-30 DIAGNOSIS — D709 Neutropenia, unspecified: Secondary | ICD-10-CM

## 2020-07-31 ENCOUNTER — Other Ambulatory Visit: Payer: Self-pay | Admitting: *Deleted

## 2020-07-31 ENCOUNTER — Telehealth: Payer: Self-pay | Admitting: *Deleted

## 2020-07-31 ENCOUNTER — Inpatient Hospital Stay: Payer: BC Managed Care – PPO | Attending: Hematology and Oncology

## 2020-07-31 ENCOUNTER — Other Ambulatory Visit: Payer: Self-pay

## 2020-07-31 DIAGNOSIS — D72819 Decreased white blood cell count, unspecified: Secondary | ICD-10-CM | POA: Insufficient documentation

## 2020-07-31 DIAGNOSIS — Z793 Long term (current) use of hormonal contraceptives: Secondary | ICD-10-CM | POA: Diagnosis not present

## 2020-07-31 DIAGNOSIS — Z79899 Other long term (current) drug therapy: Secondary | ICD-10-CM | POA: Insufficient documentation

## 2020-07-31 DIAGNOSIS — Z803 Family history of malignant neoplasm of breast: Secondary | ICD-10-CM | POA: Diagnosis not present

## 2020-07-31 DIAGNOSIS — Z8042 Family history of malignant neoplasm of prostate: Secondary | ICD-10-CM | POA: Insufficient documentation

## 2020-07-31 DIAGNOSIS — Z87440 Personal history of urinary (tract) infections: Secondary | ICD-10-CM | POA: Diagnosis not present

## 2020-07-31 DIAGNOSIS — D709 Neutropenia, unspecified: Secondary | ICD-10-CM

## 2020-07-31 LAB — CBC WITH DIFFERENTIAL (CANCER CENTER ONLY)
Abs Immature Granulocytes: 0 10*3/uL (ref 0.00–0.07)
Basophils Absolute: 0 10*3/uL (ref 0.0–0.1)
Basophils Relative: 1 %
Eosinophils Absolute: 0.1 10*3/uL (ref 0.0–0.5)
Eosinophils Relative: 3 %
HCT: 38.9 % (ref 36.0–46.0)
Hemoglobin: 13.3 g/dL (ref 12.0–15.0)
Immature Granulocytes: 0 %
Lymphocytes Relative: 63 %
Lymphs Abs: 1.3 10*3/uL (ref 0.7–4.0)
MCH: 29.7 pg (ref 26.0–34.0)
MCHC: 34.2 g/dL (ref 30.0–36.0)
MCV: 86.8 fL (ref 80.0–100.0)
Monocytes Absolute: 0.3 10*3/uL (ref 0.1–1.0)
Monocytes Relative: 15 %
Neutro Abs: 0.4 10*3/uL — CL (ref 1.7–7.7)
Neutrophils Relative %: 18 %
Platelet Count: 234 10*3/uL (ref 150–400)
RBC: 4.48 MIL/uL (ref 3.87–5.11)
RDW: 12.5 % (ref 11.5–15.5)
WBC Count: 2 10*3/uL — ABNORMAL LOW (ref 4.0–10.5)
nRBC: 0 % (ref 0.0–0.2)

## 2020-07-31 MED ORDER — VITAMIN B-12 1000 MCG PO TABS: 1000.0000 ug | ORAL_TABLET | Freq: Every day | ORAL | 3 refills | Status: DC

## 2020-07-31 NOTE — Telephone Encounter (Signed)
TCT patient regarding CBC results from this morning.  Spoke with her. Advised that her WBC and ANC has dropped again with ANC of 0.4  Pt has recently returned from [place]. She denies any s/s of infection  now or while she was in [place]. No fever, chills, night sweats etc. Overall she feels well. Advised that her B12 levels were slightly low in June and we will start her on supplemental B12  1000 mcg daily. Also advised that we will schedule repat labs and an MD visit next week. Pt is agreeable to both.  Prescription for B12 sent in. Scheduling message sent. Pt to come back on 08/05/20

## 2020-07-31 NOTE — Telephone Encounter (Signed)
Pt sis [place]. Advised that she not do this due to her neutropenia and potential for infection. Reviewed neutropenia precautions with her . She voiced understanding

## 2020-08-05 ENCOUNTER — Inpatient Hospital Stay (HOSPITAL_BASED_OUTPATIENT_CLINIC_OR_DEPARTMENT_OTHER): Payer: BC Managed Care – PPO | Admitting: Hematology and Oncology

## 2020-08-05 ENCOUNTER — Other Ambulatory Visit: Payer: Self-pay | Admitting: Hematology and Oncology

## 2020-08-05 ENCOUNTER — Other Ambulatory Visit: Payer: Self-pay

## 2020-08-05 ENCOUNTER — Telehealth: Payer: Self-pay

## 2020-08-05 ENCOUNTER — Inpatient Hospital Stay: Payer: BC Managed Care – PPO

## 2020-08-05 VITALS — BP 116/75 | HR 75 | Temp 97.3°F | Resp 18 | Ht 64.0 in | Wt 143.4 lb

## 2020-08-05 DIAGNOSIS — D709 Neutropenia, unspecified: Secondary | ICD-10-CM | POA: Diagnosis not present

## 2020-08-05 DIAGNOSIS — Z87440 Personal history of urinary (tract) infections: Secondary | ICD-10-CM | POA: Diagnosis not present

## 2020-08-05 DIAGNOSIS — Z803 Family history of malignant neoplasm of breast: Secondary | ICD-10-CM | POA: Diagnosis not present

## 2020-08-05 DIAGNOSIS — Z79899 Other long term (current) drug therapy: Secondary | ICD-10-CM | POA: Diagnosis not present

## 2020-08-05 DIAGNOSIS — Z8042 Family history of malignant neoplasm of prostate: Secondary | ICD-10-CM | POA: Diagnosis not present

## 2020-08-05 DIAGNOSIS — D72819 Decreased white blood cell count, unspecified: Secondary | ICD-10-CM | POA: Diagnosis not present

## 2020-08-05 DIAGNOSIS — Z793 Long term (current) use of hormonal contraceptives: Secondary | ICD-10-CM | POA: Diagnosis not present

## 2020-08-05 LAB — CMP (CANCER CENTER ONLY)
ALT: 14 U/L (ref 0–44)
AST: 20 U/L (ref 15–41)
Albumin: 4.1 g/dL (ref 3.5–5.0)
Alkaline Phosphatase: 78 U/L (ref 38–126)
Anion gap: 9 (ref 5–15)
BUN: 18 mg/dL (ref 6–20)
CO2: 25 mmol/L (ref 22–32)
Calcium: 9.4 mg/dL (ref 8.9–10.3)
Chloride: 106 mmol/L (ref 98–111)
Creatinine: 0.86 mg/dL (ref 0.44–1.00)
GFR, Estimated: >60 mL/min (ref >60)
Glucose, Bld: 86 mg/dL (ref 70–99)
Potassium: 4.3 mmol/L (ref 3.5–5.1)
Sodium: 140 mmol/L (ref 135–145)
Total Bilirubin: 0.6 mg/dL (ref 0.3–1.2)
Total Protein: 7.3 g/dL (ref 6.5–8.1)

## 2020-08-05 LAB — CBC WITH DIFFERENTIAL (CANCER CENTER ONLY)
Abs Immature Granulocytes: 0 10*3/uL (ref 0.00–0.07)
Basophils Absolute: 0 10*3/uL (ref 0.0–0.1)
Basophils Relative: 1 %
Eosinophils Absolute: 0 10*3/uL (ref 0.0–0.5)
Eosinophils Relative: 2 %
HCT: 39.8 % (ref 36.0–46.0)
Hemoglobin: 13.5 g/dL (ref 12.0–15.0)
Immature Granulocytes: 0 %
Lymphocytes Relative: 73 %
Lymphs Abs: 1.5 10*3/uL (ref 0.7–4.0)
MCH: 29.6 pg (ref 26.0–34.0)
MCHC: 33.9 g/dL (ref 30.0–36.0)
MCV: 87.3 fL (ref 80.0–100.0)
Monocytes Absolute: 0.4 10*3/uL (ref 0.1–1.0)
Monocytes Relative: 21 %
Neutro Abs: 0.1 10*3/uL — CL (ref 1.7–7.7)
Neutrophils Relative %: 3 %
Platelet Count: 265 10*3/uL (ref 150–400)
RBC: 4.56 MIL/uL (ref 3.87–5.11)
RDW: 12.4 % (ref 11.5–15.5)
WBC Count: 2 10*3/uL — ABNORMAL LOW (ref 4.0–10.5)
nRBC: 0 % (ref 0.0–0.2)

## 2020-08-05 LAB — LACTATE DEHYDROGENASE: LDH: 154 U/L (ref 98–192)

## 2020-08-05 MED ORDER — PANTOPRAZOLE SODIUM 40 MG PO TBEC: 40.0000 mg | DELAYED_RELEASE_TABLET | Freq: Every day | ORAL | 1 refills | Status: DC

## 2020-08-05 MED ORDER — PREDNISONE 20 MG PO TABS: ORAL_TABLET | ORAL | 0 refills | Status: AC

## 2020-08-05 NOTE — Progress Notes (Signed)
[facility] [phone]   Fax:(336) [phone]  PROGRESS NOTE  Patient Care Team: Velna Hatchet, MD as PCP - General (Internal Medicine)  Hematological/Oncological History # Leukopenia 10/07/2019: WBC 3.6, Hgb 12.4, MCV 84.9, Plt 246. ANC 1.7 06/12/2020: WBC 2.3, Hgb 13.4, MCV 90.2, Plt 250, ANC 0.2 06/19/2020: WBC 2.14, Hgb 13.6, MCV 89.3, Plt 237, ANC 0.2 07/02/2020: establish care with Dr. Lorenso Courier. WBC 2.0, Hgb 13.3, MCV 87.1, Plt 253, ANC 0.1 07/08/2020: WBC 2.4, Hgb 12.3, Plt 231, ANC 0.3 67/2022: WBC 3.1 with ANC 2.3 after prednisone 55m x 4 days 08/05/2020: WBC 2.0, ANC 0.1.   Interval History:  Debra Ikeda25y.o. female with medical history significant for severe neutropenia of unclear etiology presents for a follow up visit. The patient's last visit was on 07/02/2020. In the interim since the last visit she had a temporary pulse of steroids which boosted her ANC to 2.3. It fell to 0.4 on 07/31/2020.   On exam today Debra Henry is accompanied by her mother.  She reports that she has been perfectly healthy in the interim since her last visit.  [place]  Fortunately she did not have any issues with fevers, chills, sweats, nausea, vomiting or diarrhea.  She is had no focal infectious symptoms.  She is otherwise been virtually asymptomatic.  A full 10 point ROS is listed below.  MEDICAL HISTORY:  No past medical history on file.  SURGICAL HISTORY: No past surgical history on file.  SOCIAL HISTORY: Social History   Socioeconomic History   Marital status: Single    Spouse name: Not on file   Number of children: 0   Years of education: Not on file   Highest education level: Not on file  Occupational History   Not on file  Tobacco Use   Smoking status: Never   Smokeless tobacco: Never  Vaping Use   Vaping Use: Never used  Substance and Sexual Activity   Alcohol use: Never   Drug use: Not on  file   Sexual activity: Not on file  Other Topics Concern   Not on file  Social History Narrative   Not on file   Social Determinants of Health   Financial Resource Strain: Not on file  Food Insecurity: Not on file  Transportation Needs: Not on file  Physical Activity: Not on file  Stress: Not on file  Social Connections: Not on file  Intimate Partner Violence: Not on file    FAMILY HISTORY: Family History  Problem Relation Age of Onset   Healthy Mother    Healthy Father    Prostate cancer Paternal Uncle    Breast cancer Paternal Grandmother    Sudden death Neg Hx    Hypertension Neg Hx    Hyperlipidemia Neg Hx    Heart attack Neg Hx    Diabetes Neg Hx     ALLERGIES:  has No Known Allergies.  MEDICATIONS:  Current Outpatient Medications  Medication Sig Dispense Refill   pantoprazole (PROTONIX) 40 MG tablet Take 1 tablet (40 mg total) by mouth daily. 90 tablet 1   predniSONE (DELTASONE) 20 MG tablet Take 3 tablets (60 mg total) by mouth daily for 7 days, THEN 2.5 tablets (50 mg total) daily for 7 days, THEN 2 tablets (40 mg total) daily for 7 days, THEN 1.5 tablets (30 mg total) daily for 7 days, THEN 1 tablet (20 mg total) daily for 7 days. 70 tablet  0   benzonatate (TESSALON) 100 MG capsule Take 1 capsule (100 mg total) by mouth 3 (three) times daily as needed for cough. (Patient not taking: Reported on 07/02/2020) 30 capsule 0   Clindamycin-Benzoyl Per, Refr, gel  (Patient not taking: Reported on 07/02/2020)     levonorgestrel (KYLEENA) 19.5 MG IUD Kyleena 17.5 mcg/24 hrs (61yr) 19.538mintrauterine device  Take 1 device by intrauterine route.     LORazepam (ATIVAN) 1 MG tablet Take 1 tablet (1 mg total) by mouth as directed. Take 1 pill of ativan approximately 1-2 hours before bone marrow biopsy. 2 tablet 0   vitamin B-12 (CYANOCOBALAMIN) 1000 MCG tablet Take 1 tablet (1,000 mcg total) by mouth daily. 30 tablet 3   No current facility-administered medications for this  visit.    REVIEW OF SYSTEMS:   Constitutional: ( - ) fevers, ( - )  chills , ( - ) night sweats Eyes: ( - ) blurriness of vision, ( - ) double vision, ( - ) watery eyes Ears, nose, mouth, throat, and face: ( - ) mucositis, ( - ) sore throat Respiratory: ( - ) cough, ( - ) dyspnea, ( - ) wheezes Cardiovascular: ( - ) palpitation, ( - ) chest discomfort, ( - ) lower extremity swelling Gastrointestinal:  ( - ) nausea, ( - ) heartburn, ( - ) change in bowel habits Skin: ( - ) abnormal skin rashes Lymphatics: ( - ) new lymphadenopathy, ( - ) easy bruising Neurological: ( - ) numbness, ( - ) tingling, ( - ) new weaknesses Behavioral/Psych: ( - ) mood change, ( - ) new changes  All other systems were reviewed with the patient and are negative.  PHYSICAL EXAMINATION: ECOG PERFORMANCE STATUS: 0 - Asymptomatic  Vitals:   08/05/20 0836  BP: 116/75  Pulse: 75  Resp: 18  Temp: (!) 97.3 F (36.3 C)  SpO2: 100%   Filed Weights   08/05/20 0836  Weight: 143 lb 6.4 oz (65 kg)    GENERAL: alert, no distress and comfortable SKIN: skin color, texture, turgor are normal, no rashes or significant lesions EYES: conjunctiva are pink and non-injected, sclera clear OROPHARYNX: no exudate, no erythema; lips, buccal mucosa, and tongue normal  NECK: supple, non-tender LYMPH:  no palpable lymphadenopathy in the cervical, axillary or inguinal LUNGS: clear to auscultation and percussion with normal breathing effort HEART: regular rate & rhythm and no murmurs and no lower extremity edema ABDOMEN: soft, non-tender, non-distended, normal bowel sounds Musculoskeletal: no cyanosis of digits and no clubbing  PSYCH: alert & oriented x 3, fluent speech NEURO: no focal motor/sensory deficits  LABORATORY DATA:  I have reviewed the data as listed CBC Latest Ref Rng & Units 08/14/2020 08/05/2020 07/31/2020  WBC 4.0 - 10.5 K/uL 4.8 2.0(L) 2.0(L)  Hemoglobin 12.0 - 15.0 g/dL 13.6 13.5 13.3  Hematocrit 36.0 - 46.0 %  40.0 39.8 38.9  Platelets 150 - 400 K/uL 290 265 234    CMP Latest Ref Rng & Units 08/05/2020 07/13/2020 07/02/2020  Glucose 70 - 99 mg/dL 86 121(H) 83  BUN 6 - 20 mg/dL 18 15 18   Creatinine 0.44 - 1.00 mg/dL 0.86 0.83 0.93  Sodium 135 - 145 mmol/L 140 141 137  Potassium 3.5 - 5.1 mmol/L 4.3 4.3 4.1  Chloride 98 - 111 mmol/L 106 107 106  CO2 22 - 32 mmol/L 25 24 24   Calcium 8.9 - 10.3 mg/dL 9.4 9.1 9.7  Total Protein 6.5 - 8.1 g/dL 7.3 6.8 7.5  Total Bilirubin 0.3 - 1.2 mg/dL 0.6 0.4 0.7  Alkaline Phos 38 - 126 U/L 78 79 87  AST 15 - 41 U/L 20 13(L) 17  ALT 0 - 44 U/L 14 10 8     RADIOGRAPHIC STUDIES: No results found.  ASSESSMENT & PLAN Debra Henry 25 y.o. female with no significant past medical history who presents for follow up of of neutropenia.    After review of the labs, review of the records, and discussion with the patient the patients findings are most consistent with a severe neutropenia of unclear etiology.  The patient has no focal symptoms which would lead [place] towards a possible cause.  Also the patient has had perfectly normal hemoglobin, platelet count, and MCV throughout this whole process.  She is virtually asymptomatic with exception of 2 urinary tract infections earlier this year.    Our prior evaluation with nutritional and viral work-up were completely negative.  Bone marrow biopsy and review of peripheral blood film are also equally unrevealing.  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  We will attempt a steroid pulse to see if this will help to boost the patient's white blood cell count with a durable response.   # Leukopenia # Severe Neutropenia -- negative viral work-up with HIV, hepatitis B, hepatitis C --negative nutritional work-up with vitamin B12, folate, homocystine, and methylmalonic acid. --today will repeat CBC and CMP.  We will order LDH and review of peripheral blood film --Bone marrow  biopsy shows no evidence of abnormalities. Peripheral blood film shows rare atypical lymphocyte and near absent neutrophils.  --patient responded to temporary steroid pulse. Will repeat this with a longer course taper.  --steroid taper of 38m PO daily x 7 days, followed by decreasing the dose by 153mq 7 days.  --Return in 4 weeks with interval weekly labs.   No orders of the defined types were placed in this encounter.   All questions were answered. The patient knows to call the clinic with any problems, questions or concerns.  A total of more than 30 minutes were spent on this encounter with face-to-face time and non-face-to-face time, including preparing to see the patient, ordering tests and/or medications, counseling the patient and coordination of care as outlined above.   JoLedell PeoplesMD Department of Hematology/Oncology CoCarawayt [facility] Center[facility]: [phone]: [phone]: [email or web address]  08/17/2020 8:02 AM

## 2020-08-05 NOTE — Telephone Encounter (Signed)
CRITICAL VALUE STICKER  CRITICAL VALUE: ANC 0.1  RECEIVER (on-site recipient of call): Juanita Laster, RN  DATE & TIME NOTIFIED: 08/05/20 @ 0841  MESSENGER (representative from lab):  MD NOTIFIED: Claybon Jabs (Dr Dorsey's RN)  TIME OF NOTIFICATION: [id number]  RESPONSE:

## 2020-08-10 ENCOUNTER — Telehealth: Payer: Self-pay | Admitting: *Deleted

## 2020-08-10 ENCOUNTER — Other Ambulatory Visit: Payer: Self-pay | Admitting: *Deleted

## 2020-08-10 DIAGNOSIS — D709 Neutropenia, unspecified: Secondary | ICD-10-CM

## 2020-08-10 NOTE — Telephone Encounter (Signed)
Received call from patient. She states that she had an exposure to Covid this week-she works as a Lawyer in a nursing home. She states she had a mask on but the patient did not. Advised that as long as she had her mask on, she should be ok.  Reviewed symptoms to be aware of. Advised to get covid test if she develops any s/s. She does have Covid test kits at home. She also asked about her upcoming appts.   She is to have weekly CBC's done and then f/u appt with labs with Dr. [name] in 4 weeks.  Lab appt made for this Friday and scheduling message sent for the rest of these appts.  Pt voiced understanding.

## 2020-08-14 ENCOUNTER — Inpatient Hospital Stay: Payer: BC Managed Care – PPO

## 2020-08-14 ENCOUNTER — Other Ambulatory Visit: Payer: Self-pay

## 2020-08-14 ENCOUNTER — Telehealth: Payer: Self-pay | Admitting: *Deleted

## 2020-08-14 DIAGNOSIS — D72819 Decreased white blood cell count, unspecified: Secondary | ICD-10-CM | POA: Diagnosis not present

## 2020-08-14 DIAGNOSIS — Z8042 Family history of malignant neoplasm of prostate: Secondary | ICD-10-CM | POA: Diagnosis not present

## 2020-08-14 DIAGNOSIS — D709 Neutropenia, unspecified: Secondary | ICD-10-CM

## 2020-08-14 DIAGNOSIS — Z803 Family history of malignant neoplasm of breast: Secondary | ICD-10-CM | POA: Diagnosis not present

## 2020-08-14 DIAGNOSIS — Z793 Long term (current) use of hormonal contraceptives: Secondary | ICD-10-CM | POA: Diagnosis not present

## 2020-08-14 DIAGNOSIS — Z87440 Personal history of urinary (tract) infections: Secondary | ICD-10-CM | POA: Diagnosis not present

## 2020-08-14 DIAGNOSIS — Z79899 Other long term (current) drug therapy: Secondary | ICD-10-CM | POA: Diagnosis not present

## 2020-08-14 LAB — CBC WITH DIFFERENTIAL (CANCER CENTER ONLY)
Abs Immature Granulocytes: 0.02 10*3/uL (ref 0.00–0.07)
Basophils Absolute: 0 10*3/uL (ref 0.0–0.1)
Basophils Relative: 0 %
Eosinophils Absolute: 0 10*3/uL (ref 0.0–0.5)
Eosinophils Relative: 0 %
HCT: 40 % (ref 36.0–46.0)
Hemoglobin: 13.6 g/dL (ref 12.0–15.0)
Immature Granulocytes: 0 %
Lymphocytes Relative: 19 %
Lymphs Abs: 0.9 10*3/uL (ref 0.7–4.0)
MCH: 29.4 pg (ref 26.0–34.0)
MCHC: 34 g/dL (ref 30.0–36.0)
MCV: 86.6 fL (ref 80.0–100.0)
Monocytes Absolute: 0.4 10*3/uL (ref 0.1–1.0)
Monocytes Relative: 8 %
Neutro Abs: 3.5 10*3/uL (ref 1.7–7.7)
Neutrophils Relative %: 73 %
Platelet Count: 290 10*3/uL (ref 150–400)
RBC: 4.62 MIL/uL (ref 3.87–5.11)
RDW: 12.2 % (ref 11.5–15.5)
WBC Count: 4.8 10*3/uL (ref 4.0–10.5)
nRBC: 0 % (ref 0.0–0.2)

## 2020-08-14 NOTE — Telephone Encounter (Signed)
TCT patient regarding lab results. No answer but was able to leave vm message advising of normal WBC. Pt is to continue her prednisone as ordered.

## 2020-08-17 ENCOUNTER — Encounter: Payer: Self-pay | Admitting: Hematology and Oncology

## 2020-08-17 DIAGNOSIS — E538 Deficiency of other specified B group vitamins: Secondary | ICD-10-CM | POA: Diagnosis not present

## 2020-08-17 DIAGNOSIS — D709 Neutropenia, unspecified: Secondary | ICD-10-CM | POA: Diagnosis not present

## 2020-08-19 ENCOUNTER — Telehealth: Payer: Self-pay | Admitting: *Deleted

## 2020-08-19 NOTE — Telephone Encounter (Signed)
TCT patient regarding recent lab results. Noted pt has been to [facility] Heme/onc on 08/17/20 No answer but was able to leave vm message for pt to return call regarding lab results from here.

## 2020-08-19 NOTE — Telephone Encounter (Signed)
-----   Message from Jaci Standard, MD sent at 08/14/2020  8:21 AM EDT ----- Please let Debra Henry know that her WBC has increased to 4.8. Her ANC is up to 3500 (goal >1500, last measurement 100). The steroids appear to be working, hopefully this will give [place] a sustained response. We are currently discussing this case with other specialists in the field. For now they recommend continued steroid pulse and close monitoring.   ----- Message ----- From: Interface, Lab In [place] Sent: 08/14/2020   8:16 AM EDT To: Jaci Standard, MD

## 2020-08-21 ENCOUNTER — Other Ambulatory Visit: Payer: Self-pay

## 2020-08-21 ENCOUNTER — Inpatient Hospital Stay: Payer: BC Managed Care – PPO | Attending: Hematology and Oncology

## 2020-08-21 DIAGNOSIS — Z8042 Family history of malignant neoplasm of prostate: Secondary | ICD-10-CM | POA: Insufficient documentation

## 2020-08-21 DIAGNOSIS — L709 Acne, unspecified: Secondary | ICD-10-CM | POA: Diagnosis not present

## 2020-08-21 DIAGNOSIS — D709 Neutropenia, unspecified: Secondary | ICD-10-CM | POA: Insufficient documentation

## 2020-08-21 DIAGNOSIS — Z803 Family history of malignant neoplasm of breast: Secondary | ICD-10-CM | POA: Insufficient documentation

## 2020-08-21 DIAGNOSIS — Z79899 Other long term (current) drug therapy: Secondary | ICD-10-CM | POA: Insufficient documentation

## 2020-08-21 DIAGNOSIS — D72819 Decreased white blood cell count, unspecified: Secondary | ICD-10-CM | POA: Diagnosis not present

## 2020-08-21 LAB — CBC WITH DIFFERENTIAL (CANCER CENTER ONLY)
Abs Immature Granulocytes: 0.01 10*3/uL (ref 0.00–0.07)
Basophils Absolute: 0 10*3/uL (ref 0.0–0.1)
Basophils Relative: 0 %
Eosinophils Absolute: 0 10*3/uL (ref 0.0–0.5)
Eosinophils Relative: 0 %
HCT: 39.8 % (ref 36.0–46.0)
Hemoglobin: 13.8 g/dL (ref 12.0–15.0)
Immature Granulocytes: 0 %
Lymphocytes Relative: 36 %
Lymphs Abs: 1.4 10*3/uL (ref 0.7–4.0)
MCH: 29.8 pg (ref 26.0–34.0)
MCHC: 34.7 g/dL (ref 30.0–36.0)
MCV: 86 fL (ref 80.0–100.0)
Monocytes Absolute: 0.8 10*3/uL (ref 0.1–1.0)
Monocytes Relative: 22 %
Neutro Abs: 1.6 10*3/uL — ABNORMAL LOW (ref 1.7–7.7)
Neutrophils Relative %: 42 %
Platelet Count: 237 10*3/uL (ref 150–400)
RBC: 4.63 MIL/uL (ref 3.87–5.11)
RDW: 12.6 % (ref 11.5–15.5)
WBC Count: 3.8 10*3/uL — ABNORMAL LOW (ref 4.0–10.5)
nRBC: 0 % (ref 0.0–0.2)

## 2020-08-21 NOTE — Telephone Encounter (Signed)
Received call back from pt regarding labs and appt with Dr. Lowella Bandy @ [facility]. Reviewed today's labs with her. She is fine to keep coming here. She is aware of upcoming appts.

## 2020-08-28 ENCOUNTER — Other Ambulatory Visit: Payer: Self-pay

## 2020-08-28 ENCOUNTER — Inpatient Hospital Stay: Payer: BC Managed Care – PPO

## 2020-08-28 DIAGNOSIS — Z803 Family history of malignant neoplasm of breast: Secondary | ICD-10-CM | POA: Diagnosis not present

## 2020-08-28 DIAGNOSIS — Z8042 Family history of malignant neoplasm of prostate: Secondary | ICD-10-CM | POA: Diagnosis not present

## 2020-08-28 DIAGNOSIS — D709 Neutropenia, unspecified: Secondary | ICD-10-CM

## 2020-08-28 DIAGNOSIS — D72819 Decreased white blood cell count, unspecified: Secondary | ICD-10-CM | POA: Diagnosis not present

## 2020-08-28 DIAGNOSIS — Z79899 Other long term (current) drug therapy: Secondary | ICD-10-CM | POA: Diagnosis not present

## 2020-08-28 DIAGNOSIS — L709 Acne, unspecified: Secondary | ICD-10-CM | POA: Diagnosis not present

## 2020-08-28 LAB — CBC WITH DIFFERENTIAL (CANCER CENTER ONLY)
Abs Immature Granulocytes: 0.01 10*3/uL (ref 0.00–0.07)
Basophils Absolute: 0 10*3/uL (ref 0.0–0.1)
Basophils Relative: 0 %
Eosinophils Absolute: 0 10*3/uL (ref 0.0–0.5)
Eosinophils Relative: 0 %
HCT: 39.8 % (ref 36.0–46.0)
Hemoglobin: 13.6 g/dL (ref 12.0–15.0)
Immature Granulocytes: 0 %
Lymphocytes Relative: 14 %
Lymphs Abs: 0.6 10*3/uL — ABNORMAL LOW (ref 0.7–4.0)
MCH: 29.8 pg (ref 26.0–34.0)
MCHC: 34.2 g/dL (ref 30.0–36.0)
MCV: 87.1 fL (ref 80.0–100.0)
Monocytes Absolute: 0.2 10*3/uL (ref 0.1–1.0)
Monocytes Relative: 5 %
Neutro Abs: 3.4 10*3/uL (ref 1.7–7.7)
Neutrophils Relative %: 81 %
Platelet Count: 208 10*3/uL (ref 150–400)
RBC: 4.57 MIL/uL (ref 3.87–5.11)
RDW: 12.8 % (ref 11.5–15.5)
WBC Count: 4.1 10*3/uL (ref 4.0–10.5)
nRBC: 0 % (ref 0.0–0.2)

## 2020-08-31 ENCOUNTER — Telehealth: Payer: Self-pay | Admitting: *Deleted

## 2020-08-31 NOTE — Telephone Encounter (Signed)
TCT patient regarding labs from 08/28/20. Spoke with her and advised that her WBC and ANC are good-within normal limits. She states she has about 2 more weeks left on the steroids. She does say she is having skin breakouts-like acne since starting the steroids. Advised that is not unusual, hopefully it will clear up after she completes the steroids. She is aware of her appts next week.

## 2020-08-31 NOTE — Telephone Encounter (Signed)
-----   Message from Jaci Standard, MD sent at 08/30/2020  7:10 PM EDT ----- Please let Mrs. Appleman know that her last WBC looked good. WBC >4.0 and ANC was elevated at >3.0. We will plan to see her back on 09/07/2020.  ----- Message ----- From: Leory Plowman, Lab In [place] Sent: 08/28/2020   8:30 AM EDT To: Jaci Standard, MD

## 2020-09-07 ENCOUNTER — Other Ambulatory Visit: Payer: Self-pay

## 2020-09-07 ENCOUNTER — Inpatient Hospital Stay (HOSPITAL_BASED_OUTPATIENT_CLINIC_OR_DEPARTMENT_OTHER): Payer: BC Managed Care – PPO | Admitting: Hematology and Oncology

## 2020-09-07 ENCOUNTER — Inpatient Hospital Stay: Payer: BC Managed Care – PPO

## 2020-09-07 VITALS — BP 111/77 | HR 80 | Temp 97.8°F | Resp 16 | Ht 64.0 in | Wt 144.3 lb

## 2020-09-07 DIAGNOSIS — D709 Neutropenia, unspecified: Secondary | ICD-10-CM | POA: Diagnosis not present

## 2020-09-07 DIAGNOSIS — L709 Acne, unspecified: Secondary | ICD-10-CM | POA: Diagnosis not present

## 2020-09-07 DIAGNOSIS — Z8042 Family history of malignant neoplasm of prostate: Secondary | ICD-10-CM | POA: Diagnosis not present

## 2020-09-07 DIAGNOSIS — Z803 Family history of malignant neoplasm of breast: Secondary | ICD-10-CM | POA: Diagnosis not present

## 2020-09-07 DIAGNOSIS — Z79899 Other long term (current) drug therapy: Secondary | ICD-10-CM | POA: Diagnosis not present

## 2020-09-07 DIAGNOSIS — D72819 Decreased white blood cell count, unspecified: Secondary | ICD-10-CM | POA: Diagnosis not present

## 2020-09-07 LAB — CBC WITH DIFFERENTIAL (CANCER CENTER ONLY)
Abs Immature Granulocytes: 0.01 10*3/uL (ref 0.00–0.07)
Basophils Absolute: 0 10*3/uL (ref 0.0–0.1)
Basophils Relative: 0 %
Eosinophils Absolute: 0 10*3/uL (ref 0.0–0.5)
Eosinophils Relative: 1 %
HCT: 40 % (ref 36.0–46.0)
Hemoglobin: 13.7 g/dL (ref 12.0–15.0)
Immature Granulocytes: 0 %
Lymphocytes Relative: 61 %
Lymphs Abs: 2.1 10*3/uL (ref 0.7–4.0)
MCH: 30.2 pg (ref 26.0–34.0)
MCHC: 34.3 g/dL (ref 30.0–36.0)
MCV: 88.3 fL (ref 80.0–100.0)
Monocytes Absolute: 0.7 10*3/uL (ref 0.1–1.0)
Monocytes Relative: 19 %
Neutro Abs: 0.7 10*3/uL — ABNORMAL LOW (ref 1.7–7.7)
Neutrophils Relative %: 19 %
Platelet Count: 211 10*3/uL (ref 150–400)
RBC: 4.53 MIL/uL (ref 3.87–5.11)
RDW: 13.1 % (ref 11.5–15.5)
WBC Count: 3.5 10*3/uL — ABNORMAL LOW (ref 4.0–10.5)
nRBC: 0 % (ref 0.0–0.2)

## 2020-09-07 NOTE — Progress Notes (Signed)
[place] Telephone:([phone]   Fax:(336) [phone]  PROGRESS NOTE  Patient Care Team: Velna Hatchet, MD as PCP - General (Internal Medicine)  Hematological/Oncological History # Leukopenia 10/07/2019: WBC 3.6, Hgb 12.4, MCV 84.9, Plt 246. ANC 1.7 06/12/2020: WBC 2.3, Hgb 13.4, MCV 90.2, Plt 250, ANC 0.2 06/19/2020: WBC 2.14, Hgb 13.6, MCV 89.3, Plt 237, ANC 0.2 07/02/2020: establish care with Dr. Lorenso Courier. WBC 2.0, Hgb 13.3, MCV 87.1, Plt 253, ANC 0.1 07/08/2020: WBC 2.4, Hgb 12.3, Plt 231, ANC 0.3 67/2022: WBC 3.1 with ANC 2.3 after prednisone $RemoveBeforeDE'40mg'LOeDPjheVFTIlkC$  x 4 days 08/05/2020: WBC 2.0, ANC 0.1.  08/28/2020: WBC 4.1, Hgb 13.6, MCV 87.1, Plt 208. ANC 3.4 09/07/2020: WBC 3.5, Hgb 13.7, MCV 88.3, Plt 211. ANC 0.7  Interval History:  Debra Henry 25 y.o. female with medical history significant for severe neutropenia of unclear etiology presents for a follow up visit. The patient's last visit was on 08/05/2020. In the interim since the last visit she has continued on a taper of steroids which boosted her Stevens to 3.4 on 08/28/2020. It fell to 0.7 today.  On exam today Debra Henry is accompanied by her mother.  She reports that she is currently on 1 pill of steroid per day and has been tolerating it well.  She has developed some acne with the steroid treatment.  She notes that she is not having any issues with fevers, chills, sweats.  She has had no infectious symptoms.  She is otherwise been perfectly healthy.  Also in the interim she has seen Dr. Joan Mayans at [facility] for second opinion.  A full 10 point ROS is listed below.  MEDICAL HISTORY:  No past medical history on file.  SURGICAL HISTORY: No past surgical history on file.  SOCIAL HISTORY: Social History   Socioeconomic History   Marital status: Single    Spouse name: Not on file   Number of children: 0   Years of education: Not on file   Highest education level: Not on file  Occupational History   Not on  file  Tobacco Use   Smoking status: Never   Smokeless tobacco: Never  Vaping Use   Vaping Use: Never used  Substance and Sexual Activity   Alcohol use: Never   Drug use: Not on file   Sexual activity: Not on file  Other Topics Concern   Not on file  Social History Narrative   Not on file   Social Determinants of Health   Financial Resource Strain: Not on file  Food Insecurity: Not on file  Transportation Needs: Not on file  Physical Activity: Not on file  Stress: Not on file  Social Connections: Not on file  Intimate Partner Violence: Not on file    FAMILY HISTORY: Family History  Problem Relation Age of Onset   Healthy Mother    Healthy Father    Prostate cancer Paternal Uncle    Breast cancer Paternal Grandmother    Sudden death Neg Hx    Hypertension Neg Hx    Hyperlipidemia Neg Hx    Heart attack Neg Hx    Diabetes Neg Hx     ALLERGIES:  has No Known Allergies.  MEDICATIONS:  Current Outpatient Medications  Medication Sig Dispense Refill   benzonatate (TESSALON) 100 MG capsule Take 1 capsule (100 mg total) by mouth 3 (three) times daily as needed for cough. (Patient not taking: Reported on 07/02/2020) 30 capsule 0   Clindamycin-Benzoyl Per, Refr, gel  (Patient  not taking: Reported on 07/02/2020)     levonorgestrel (KYLEENA) 19.5 MG IUD Kyleena 17.5 mcg/24 hrs (58yr) 19.577mintrauterine device  Take 1 device by intrauterine route.     LORazepam (ATIVAN) 1 MG tablet Take 1 tablet (1 mg total) by mouth as directed. Take 1 pill of ativan approximately 1-2 hours before bone marrow biopsy. 2 tablet 0   pantoprazole (PROTONIX) 40 MG tablet Take 1 tablet (40 mg total) by mouth daily. 90 tablet 1   predniSONE (DELTASONE) 20 MG tablet Take 3 tablets (60 mg total) by mouth daily for 7 days, THEN 2.5 tablets (50 mg total) daily for 7 days, THEN 2 tablets (40 mg total) daily for 7 days, THEN 1.5 tablets (30 mg total) daily for 7 days, THEN 1 tablet (20 mg total) daily for 7  days. 70 tablet 0   vitamin B-12 (CYANOCOBALAMIN) 1000 MCG tablet Take 1 tablet (1,000 mcg total) by mouth daily. 30 tablet 3   No current facility-administered medications for this visit.    REVIEW OF SYSTEMS:   Constitutional: ( - ) fevers, ( - )  chills , ( - ) night sweats Eyes: ( - ) blurriness of vision, ( - ) double vision, ( - ) watery eyes Ears, nose, mouth, throat, and face: ( - ) mucositis, ( - ) sore throat Respiratory: ( - ) cough, ( - ) dyspnea, ( - ) wheezes Cardiovascular: ( - ) palpitation, ( - ) chest discomfort, ( - ) lower extremity swelling Gastrointestinal:  ( - ) nausea, ( - ) heartburn, ( - ) change in bowel habits Skin: ( - ) abnormal skin rashes Lymphatics: ( - ) new lymphadenopathy, ( - ) easy bruising Neurological: ( - ) numbness, ( - ) tingling, ( - ) new weaknesses Behavioral/Psych: ( - ) mood change, ( - ) new changes  All other systems were reviewed with the patient and are negative.  PHYSICAL EXAMINATION: ECOG PERFORMANCE STATUS: 0 - Asymptomatic  Vitals:   09/07/20 1341  BP: 111/77  Pulse: 80  Resp: 16  Temp: 97.8 F (36.6 C)  SpO2: 99%   Filed Weights   09/07/20 1341  Weight: 144 lb 4.8 oz (65.5 kg)    GENERAL: Well-appearing young Caucasian female, alert, no distress and comfortable SKIN: skin color, texture, turgor are normal, no rashes or significant lesions EYES: conjunctiva are pink and non-injected, sclera clear LUNGS: clear to auscultation and percussion with normal breathing effort HEART: regular rate & rhythm and no murmurs and no lower extremity edema Musculoskeletal: no cyanosis of digits and no clubbing  PSYCH: alert & oriented x 3, fluent speech NEURO: no focal motor/sensory deficits  LABORATORY DATA:  I have reviewed the data as listed CBC Latest Ref Rng & Units 09/07/2020 08/28/2020 08/21/2020  WBC 4.0 - 10.5 K/uL 3.5(L) 4.1 3.8(L)  Hemoglobin 12.0 - 15.0 g/dL 13.7 13.6 13.8  Hematocrit 36.0 - 46.0 % 40.0 39.8 39.8   Platelets 150 - 400 K/uL 211 208 237    CMP Latest Ref Rng & Units 08/05/2020 07/13/2020 07/02/2020  Glucose 70 - 99 mg/dL 86 121(H) 83  BUN 6 - 20 mg/dL _0 Creatinine 0.44 - 1.00 mg/dL 0.86 0.83 0.93  Sodium 135 - 145 mmol/L 140 141 137  Potassium 3.5 - 5.1 mmol/L 4.3 4.3 4.1  Chloride 98 - 111 mmol/L 106 107 106  CO2 22 - 32 mmol/L _1 Calcium 8.9 - 10.3 mg/dL 9.4 9.1 9.7  Total Protein 6.5 - 8.1 g/dL 7.3 6.8 7.5  Total Bilirubin 0.3 - 1.2 mg/dL 0.6 0.4 0.7  Alkaline Phos 38 - 126 U/L 78 79 87  AST 15 - 41 U/L 20 13(L) 17  ALT 0 - 44 U/L _0 RADIOGRAPHIC STUDIES: No results found.  ASSESSMENT & PLAN Debra Henry 25 y.o. female with no significant past medical history who presents for follow up of of neutropenia.    After review of the labs, review of the records, and discussion with the patient the patients findings are most consistent with a severe neutropenia of unclear etiology.  The patient has no focal symptoms which would lead [place] towards a possible cause.  Also the patient has had perfectly normal hemoglobin, platelet count, and MCV throughout this whole process.  She is virtually asymptomatic with exception of 2 urinary tract infections earlier this year.    Our prior evaluation with nutritional and viral work-up were completely negative.  Bone marrow biopsy and review of peripheral blood film are also equally unrevealing.  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  We will attempt a steroid pulse to see if this will help to boost the patient's white blood cell count with a durable response.  This case has been discussed with Dr. Joan Mayans at [facility].   # Leukopenia # Severe Neutropenia -- negative viral work-up with HIV, hepatitis B, hepatitis C --negative nutritional work-up with vitamin B12, folate, homocystine, and methylmalonic acid. -CBC today shows an ANC of 0.7, unfortunately  declined from prior. --Bone marrow biopsy shows no evidence of abnormalities. Peripheral blood film shows rare atypical lymphocyte and near absent neutrophils.  --patient responded to temporary steroid pulse. Will repeat this with a longer course taper.  --steroid taper of 58m PO daily x 7 days, followed by decreasing the dose by 113mq 7 days. She is currently on her last dose of steroids --[facility] She agrees with our current management plan. --If we are unable to keep the patient's ANCentrallevated would recommend she monitor for bacterial infections and return to [place] she would develop a temperature greater than 100.6 F.  We could administer G-CSF in attempt to increase her white blood cell count during infection. --Return in 2 weeks for labs and in 3 months for a clinic visit   No orders of the defined types were placed in this encounter.   All questions were answered. The patient knows to call the clinic with any problems, questions or concerns.  A total of more than 30 minutes were spent on this encounter with face-to-face time and non-face-to-face time, including preparing to see the patient, ordering tests and/or medications, counseling the patient and coordination of care as outlined above.   JoLedell PeoplesMD Department of Hematology/Oncology CoElmirat [facility]Un[facility] A Medical Corporationhone: [phone]: [phone]: [email or web address]  09/07/2020 5:21 PM

## 2020-09-09 ENCOUNTER — Telehealth: Payer: Self-pay | Admitting: Hematology and Oncology

## 2020-09-09 NOTE — Telephone Encounter (Signed)
Scheduled per los. Called and left msg. Mailed printout  °

## 2020-09-14 ENCOUNTER — Other Ambulatory Visit: Payer: Self-pay | Admitting: Hematology and Oncology

## 2020-09-14 ENCOUNTER — Inpatient Hospital Stay: Payer: BC Managed Care – PPO

## 2020-09-14 ENCOUNTER — Other Ambulatory Visit: Payer: Self-pay

## 2020-09-14 DIAGNOSIS — D709 Neutropenia, unspecified: Secondary | ICD-10-CM | POA: Diagnosis not present

## 2020-09-14 DIAGNOSIS — Z803 Family history of malignant neoplasm of breast: Secondary | ICD-10-CM | POA: Diagnosis not present

## 2020-09-14 DIAGNOSIS — L709 Acne, unspecified: Secondary | ICD-10-CM | POA: Diagnosis not present

## 2020-09-14 DIAGNOSIS — D72819 Decreased white blood cell count, unspecified: Secondary | ICD-10-CM | POA: Diagnosis not present

## 2020-09-14 DIAGNOSIS — Z79899 Other long term (current) drug therapy: Secondary | ICD-10-CM | POA: Diagnosis not present

## 2020-09-14 DIAGNOSIS — Z8042 Family history of malignant neoplasm of prostate: Secondary | ICD-10-CM | POA: Diagnosis not present

## 2020-09-14 LAB — CBC WITH DIFFERENTIAL (CANCER CENTER ONLY)
Abs Immature Granulocytes: 0.02 10*3/uL (ref 0.00–0.07)
Basophils Absolute: 0 10*3/uL (ref 0.0–0.1)
Basophils Relative: 0 %
Eosinophils Absolute: 0 10*3/uL (ref 0.0–0.5)
Eosinophils Relative: 1 %
HCT: 38.1 % (ref 36.0–46.0)
Hemoglobin: 13.1 g/dL (ref 12.0–15.0)
Immature Granulocytes: 1 %
Lymphocytes Relative: 40 %
Lymphs Abs: 1.4 10*3/uL (ref 0.7–4.0)
MCH: 30.3 pg (ref 26.0–34.0)
MCHC: 34.4 g/dL (ref 30.0–36.0)
MCV: 88 fL (ref 80.0–100.0)
Monocytes Absolute: 0.5 10*3/uL (ref 0.1–1.0)
Monocytes Relative: 14 %
Neutro Abs: 1.6 10*3/uL — ABNORMAL LOW (ref 1.7–7.7)
Neutrophils Relative %: 44 %
Platelet Count: 236 10*3/uL (ref 150–400)
RBC: 4.33 MIL/uL (ref 3.87–5.11)
RDW: 12.6 % (ref 11.5–15.5)
WBC Count: 3.6 10*3/uL — ABNORMAL LOW (ref 4.0–10.5)
nRBC: 0 % (ref 0.0–0.2)

## 2020-09-18 ENCOUNTER — Telehealth: Payer: Self-pay | Admitting: *Deleted

## 2020-09-18 NOTE — Telephone Encounter (Signed)
-----   Message from Jaci Standard, MD sent at 09/14/2020  4:02 PM EDT ----- Please let Debra Henry know that her WBC are 3.6 and her ANC is 1.6 ( at goal of >1.5). Her WBC do not appear to have a clear pattern. They rise and fall from check to check. We will continue to monitor.  ----- Message ----- From: Interface, Lab In [place] Sent: 09/14/2020   3:35 PM EDT To: Jaci Standard, MD

## 2020-09-18 NOTE — Telephone Encounter (Signed)
TCT patient regarding lab results. Bo answer but was able to leave vm message advising that her WBC is within normal limits. Dr. [name] states there is not clear pattern to its ups and downs. We will continue to monitor. Advised that she can call back with any questions or concerns or any s/s of infection.

## 2020-09-24 ENCOUNTER — Telehealth: Payer: Self-pay | Admitting: *Deleted

## 2020-09-24 NOTE — Telephone Encounter (Signed)
Received call from pt's mother. Debra Henry states that Debra Henry was exposed to Covid over the weekend and is now symptomatic.  She has a scratchy throat and fatigue. Temp only at 98.4. Advised that she get tested for Covid and contact her PCP about this as he will be the one to prescribe the oral medication for Covid. Advised that Debra Henry wear a mask around her and Debra Henry to wear a mask as well when in the same room with people for 10 days. Advised that Debra Henry's WBC was normal last week. Debra Henry voiced understanding.

## 2020-10-19 DIAGNOSIS — Z8616 Personal history of COVID-19: Secondary | ICD-10-CM | POA: Diagnosis not present

## 2020-10-19 DIAGNOSIS — D708 Other neutropenia: Secondary | ICD-10-CM | POA: Diagnosis not present

## 2020-12-03 ENCOUNTER — Ambulatory Visit: Payer: BC Managed Care – PPO | Admitting: Hematology and Oncology

## 2020-12-03 ENCOUNTER — Other Ambulatory Visit: Payer: BC Managed Care – PPO

## 2020-12-11 ENCOUNTER — Inpatient Hospital Stay: Payer: BC Managed Care – PPO | Attending: Hematology and Oncology

## 2020-12-11 ENCOUNTER — Inpatient Hospital Stay (HOSPITAL_BASED_OUTPATIENT_CLINIC_OR_DEPARTMENT_OTHER): Payer: BC Managed Care – PPO | Admitting: Hematology and Oncology

## 2020-12-11 ENCOUNTER — Other Ambulatory Visit: Payer: Self-pay

## 2020-12-11 VITALS — BP 109/71 | HR 80 | Temp 97.9°F | Resp 17 | Wt 143.8 lb

## 2020-12-11 DIAGNOSIS — D709 Neutropenia, unspecified: Secondary | ICD-10-CM | POA: Diagnosis not present

## 2020-12-11 DIAGNOSIS — D72819 Decreased white blood cell count, unspecified: Secondary | ICD-10-CM | POA: Diagnosis not present

## 2020-12-11 DIAGNOSIS — Z8616 Personal history of COVID-19: Secondary | ICD-10-CM | POA: Diagnosis not present

## 2020-12-11 LAB — CBC WITH DIFFERENTIAL (CANCER CENTER ONLY)
Abs Immature Granulocytes: 0 10*3/uL (ref 0.00–0.07)
Basophils Absolute: 0 10*3/uL (ref 0.0–0.1)
Basophils Relative: 0 %
Eosinophils Absolute: 0.1 10*3/uL (ref 0.0–0.5)
Eosinophils Relative: 2 %
HCT: 38.4 % (ref 36.0–46.0)
Hemoglobin: 12.8 g/dL (ref 12.0–15.0)
Immature Granulocytes: 0 %
Lymphocytes Relative: 58 %
Lymphs Abs: 1.6 10*3/uL (ref 0.7–4.0)
MCH: 28.9 pg (ref 26.0–34.0)
MCHC: 33.3 g/dL (ref 30.0–36.0)
MCV: 86.7 fL (ref 80.0–100.0)
Monocytes Absolute: 0.3 10*3/uL (ref 0.1–1.0)
Monocytes Relative: 12 %
Neutro Abs: 0.7 10*3/uL — ABNORMAL LOW (ref 1.7–7.7)
Neutrophils Relative %: 28 %
Platelet Count: 212 10*3/uL (ref 150–400)
RBC: 4.43 MIL/uL (ref 3.87–5.11)
RDW: 12 % (ref 11.5–15.5)
WBC Count: 2.7 10*3/uL — ABNORMAL LOW (ref 4.0–10.5)
nRBC: 0 % (ref 0.0–0.2)

## 2020-12-11 NOTE — Progress Notes (Signed)
[place] [phone]) [phone]   Fax:([phone]) [phone]  PROGRESS NOTE  Patient Care Team: Velna Hatchet, MD as PCP - General (Internal Medicine)  Hematological/Oncological History # Leukopenia 10/07/2019: WBC 3.6, Hgb 12.4, MCV 84.9, Plt 246. ANC 1.7 06/12/2020: WBC 2.3, Hgb 13.4, MCV 90.2, Plt 250, ANC 0.2 06/19/2020: WBC 2.14, Hgb 13.6, MCV 89.3, Plt 237, ANC 0.2 07/02/2020: establish care with Dr. Lorenso Courier. WBC 2.0, Hgb 13.3, MCV 87.1, Plt 253, ANC 0.1 07/08/2020: WBC 2.4, Hgb 12.3, Plt 231, ANC 0.3 67/2022: WBC 3.1 with ANC 2.3 after prednisone 59m x 4 days 08/05/2020: WBC 2.0, ANC 0.1.  08/28/2020: WBC 4.1, Hgb 13.6, MCV 87.1, Plt 208. ANC 3.4 09/07/2020: WBC 3.5, Hgb 13.7, MCV 88.3, Plt 211. ANC 0.7  Interval History:  ATeodora Baumgarten89y.o. female with medical history significant for severe neutropenia of unclear etiology presents for a follow up visit. The patient's last visit was on 08/05/2020. In the interim since the last visit she had a COVID infection. Today ANC is 0.7.  On exam today Mrs. RHalberstadtis unaccompanied.  She reports that she had a COVID infection back in September 2022.  She unfortunately picked that up during a bachelorette party.  She notes that when she had COVID "everything hurt".  She did get Paxlovid as part of her treatment.  She does not require any hospitalization and has had no bacterial infections in the interim since her last visit.  She denies any sinus infection, urinary tract infection, or bowel disorders.  She reports that she does feel tired/wiped out but that may be due to working more and a recent break-up with her boyfriend.  She notes that she is prone to occasional episodes of loose stools.  Otherwise she has had no major changes in her health.  She reports [facility]  A full 10 point ROS is listed below.  MEDICAL HISTORY:  No past medical history on  file.  SURGICAL HISTORY: No past surgical history on file.  SOCIAL HISTORY: Social History   Socioeconomic History   Marital status: Single    Spouse name: Not on file   Number of children: 0   Years of education: Not on file   Highest education level: Not on file  Occupational History   Not on file  Tobacco Use   Smoking status: Never   Smokeless tobacco: Never  Vaping Use   Vaping Use: Never used  Substance and Sexual Activity   Alcohol use: Never   Drug use: Not on file   Sexual activity: Not on file  Other Topics Concern   Not on file  Social History Narrative   Not on file   Social Determinants of Health   Financial Resource Strain: Not on file  Food Insecurity: Not on file  Transportation Needs: Not on file  Physical Activity: Not on file  Stress: Not on file  Social Connections: Not on file  Intimate Partner Violence: Not on file    FAMILY HISTORY: Family History  Problem Relation Age of Onset   Healthy Mother    Healthy Father    Prostate cancer Paternal Uncle    Breast cancer Paternal Grandmother    Sudden death Neg Hx    Hypertension Neg Hx    Hyperlipidemia Neg Hx    Heart attack Neg Hx    Diabetes Neg Hx     ALLERGIES:  has No Known Allergies.  MEDICATIONS:  Current Outpatient Medications  Medication Sig Dispense Refill   levonorgestrel (KYLEENA) 19.5 MG IUD Kyleena 17.5 mcg/24 hrs (17yr) 19.575mintrauterine device  Take 1 device by intrauterine route.     No current facility-administered medications for this visit.    REVIEW OF SYSTEMS:   Constitutional: ( - ) fevers, ( - )  chills , ( - ) night sweats Eyes: ( - ) blurriness of vision, ( - ) double vision, ( - ) watery eyes Ears, nose, mouth, throat, and face: ( - ) mucositis, ( - ) sore throat Respiratory: ( - ) cough, ( - ) dyspnea, ( - ) wheezes Cardiovascular: ( - ) palpitation, ( - ) chest discomfort, ( - ) lower extremity swelling Gastrointestinal:  ( - ) nausea, ( - )  heartburn, ( - ) change in bowel habits Skin: ( - ) abnormal skin rashes Lymphatics: ( - ) new lymphadenopathy, ( - ) easy bruising Neurological: ( - ) numbness, ( - ) tingling, ( - ) new weaknesses Behavioral/Psych: ( - ) mood change, ( - ) new changes  All other systems were reviewed with the patient and are negative.  PHYSICAL EXAMINATION: ECOG PERFORMANCE STATUS: 0 - Asymptomatic  Vitals:   12/11/20 0839  BP: 109/71  Pulse: 80  Resp: 17  Temp: 97.9 F (36.6 C)  SpO2: 100%   Filed Weights   12/11/20 0839  Weight: 143 lb 12.8 oz (65.2 kg)    GENERAL: Well-appearing young Caucasian female, alert, no distress and comfortable SKIN: skin color, texture, turgor are normal, no rashes or significant lesions EYES: conjunctiva are pink and non-injected, sclera clear LUNGS: clear to auscultation and percussion with normal breathing effort HEART: regular rate & rhythm and no murmurs and no lower extremity edema Musculoskeletal: no cyanosis of digits and no clubbing  PSYCH: alert & oriented x 3, fluent speech NEURO: no focal motor/sensory deficits  LABORATORY DATA:  I have reviewed the data as listed CBC Latest Ref Rng & Units 12/11/2020 09/14/2020 09/07/2020  WBC 4.0 - 10.5 K/uL 2.7(L) 3.6(L) 3.5(L)  Hemoglobin 12.0 - 15.0 g/dL 12.8 13.1 13.7  Hematocrit 36.0 - 46.0 % 38.4 38.1 40.0  Platelets 150 - 400 K/uL 212 236 211    CMP Latest Ref Rng & Units 08/05/2020 07/13/2020 07/02/2020  Glucose 70 - 99 mg/dL 86 121(H) 83  BUN 6 - 20 mg/dL _0 Creatinine 0.44 - 1.00 mg/dL 0.86 0.83 0.93  Sodium 135 - 145 mmol/L 140 141 137  Potassium 3.5 - 5.1 mmol/L 4.3 4.3 4.1  Chloride 98 - 111 mmol/L 106 107 106  CO2 22 - 32 mmol/L _1 Calcium 8.9 - 10.3 mg/dL 9.4 9.1 9.7  Total Protein 6.5 - 8.1 g/dL 7.3 6.8 7.5  Total Bilirubin 0.3 - 1.2 mg/dL 0.6 0.4 0.7  Alkaline Phos 38 - 126 U/L 78 79 87  AST 15 - 41 U/L 20 13(L) 17  ALT 0 - 44 U/L _2 RADIOGRAPHIC STUDIES: No  results found.  ASSESSMENT & PLAN AsAlizia Greif89.o. female with no significant past medical history who presents for follow up of of neutropenia.    After review of the labs, review of the records, and discussion with the patient the patients findings are most consistent with a severe neutropenia of unclear etiology.  The patient has no focal symptoms which would lead us[place]owards a possible cause.  Also the patient has had perfectly  normal hemoglobin, platelet count, and MCV throughout this whole process.  She is virtually asymptomatic.  Our prior evaluation with nutritional and viral work-up were completely negative.  Bone marrow biopsy and review of peripheral blood film are also equally unrevealing.  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  We will attempt a steroid pulse to see if this will help to boost the patient's white blood cell count with a durable response.  This case has been discussed with Dr. Joan Mayans at [facility].   # Leukopenia # Severe Neutropenia -- negative viral work-up with HIV, hepatitis B, hepatitis C --negative nutritional work-up with vitamin B12, folate, homocystine, and methylmalonic acid. -CBC today shows an ANC of 0.7, stable from prior. --Bone marrow biopsy shows no evidence of abnormalities. Peripheral blood film shows rare atypical lymphocyte and near absent neutrophils.  --steroid taper temporarily improved ANC. It has dropped back down after steroids were d/c --Patient has been seen by Dr. Joan Mayans at [facility].  She agrees with our current management plan. --If we are unable to keep the patient's [place] elevated would recommend she monitor for bacterial infections and return to [place] if she would develop a temperature greater than 100.6 F.  We could administer G-CSF in attempt to increase her white blood cell count during infection. --Return in 6 months for a clinic visit    No orders of the defined types were placed in this encounter.   All questions were answered. The patient knows to call the clinic with any problems, questions or concerns.  A total of more than 30 minutes were spent on this encounter with face-to-face time and non-face-to-face time, including preparing to see the patient, ordering tests and/or medications, counseling the patient and coordination of care as outlined above.   Ledell Peoples, MD Department of Hematology/Oncology [place] [facility] Phone: [phone] Pager: [phone] Email: [email or web address]  12/13/2020 1:55 PM

## 2020-12-15 ENCOUNTER — Other Ambulatory Visit: Payer: Self-pay

## 2020-12-15 ENCOUNTER — Encounter (HOSPITAL_BASED_OUTPATIENT_CLINIC_OR_DEPARTMENT_OTHER): Payer: Self-pay | Admitting: *Deleted

## 2020-12-15 ENCOUNTER — Emergency Department (HOSPITAL_BASED_OUTPATIENT_CLINIC_OR_DEPARTMENT_OTHER)
Admission: EM | Admit: 2020-12-15 | Discharge: 2020-12-15 | Disposition: A | Discharge status: Home / Self Care | Payer: BC Managed Care – PPO | Attending: Emergency Medicine | Admitting: Emergency Medicine

## 2020-12-15 ENCOUNTER — Emergency Department (HOSPITAL_BASED_OUTPATIENT_CLINIC_OR_DEPARTMENT_OTHER): Payer: BC Managed Care – PPO

## 2020-12-15 DIAGNOSIS — R1030 Lower abdominal pain, unspecified: Secondary | ICD-10-CM | POA: Insufficient documentation

## 2020-12-15 DIAGNOSIS — R109 Unspecified abdominal pain: Secondary | ICD-10-CM | POA: Diagnosis not present

## 2020-12-15 HISTORY — DX: Neutropenia, unspecified: D70.9

## 2020-12-15 LAB — COMPREHENSIVE METABOLIC PANEL
ALT: 10 U/L (ref 0–44)
AST: 16 U/L (ref 15–41)
Albumin: 4.7 g/dL (ref 3.5–5.0)
Alkaline Phosphatase: 68 U/L (ref 38–126)
Anion gap: 10 (ref 5–15)
BUN: 15 mg/dL (ref 6–20)
CO2: 26 mmol/L (ref 22–32)
Calcium: 9.7 mg/dL (ref 8.9–10.3)
Chloride: 104 mmol/L (ref 98–111)
Creatinine, Ser: 0.94 mg/dL (ref 0.44–1.00)
GFR, Estimated: >60 mL/min (ref >60)
Glucose, Bld: 89 mg/dL (ref 70–99)
Potassium: 4.1 mmol/L (ref 3.5–5.1)
Sodium: 140 mmol/L (ref 135–145)
Total Bilirubin: 0.5 mg/dL (ref 0.3–1.2)
Total Protein: 7 g/dL (ref 6.5–8.1)

## 2020-12-15 LAB — CBC
HCT: 40 % (ref 36.0–46.0)
Hemoglobin: 13.6 g/dL (ref 12.0–15.0)
MCH: 29.5 pg (ref 26.0–34.0)
MCHC: 34 g/dL (ref 30.0–36.0)
MCV: 86.8 fL (ref 80.0–100.0)
Platelets: 255 10*3/uL (ref 150–400)
RBC: 4.61 MIL/uL (ref 3.87–5.11)
RDW: 12.4 % (ref 11.5–15.5)
WBC: 4.2 10*3/uL (ref 4.0–10.5)
nRBC: 0 % (ref 0.0–0.2)

## 2020-12-15 LAB — PREGNANCY, URINE: Preg Test, Ur: NEGATIVE

## 2020-12-15 LAB — URINALYSIS, ROUTINE W REFLEX MICROSCOPIC
Bilirubin Urine: NEGATIVE
Glucose, UA: NEGATIVE mg/dL
Ketones, ur: NEGATIVE mg/dL
Leukocytes,Ua: NEGATIVE
Nitrite: NEGATIVE
Specific Gravity, Urine: 1.026 (ref 1.005–1.030)
pH: 6.5 (ref 5.0–8.0)

## 2020-12-15 LAB — LIPASE, BLOOD: Lipase: 68 U/L — ABNORMAL HIGH (ref 11–51)

## 2020-12-15 MED ORDER — DICYCLOMINE HCL 20 MG PO TABS: 20.0000 mg | ORAL_TABLET | Freq: Two times a day (BID) | ORAL | 0 refills | Status: DC

## 2020-12-15 MED ORDER — DICYCLOMINE HCL 10 MG PO CAPS
10.0000 mg | ORAL_CAPSULE | Freq: Once | ORAL | Status: AC
  Administered 2020-12-15: 10 mg via ORAL

## 2020-12-15 MED ORDER — KETOROLAC TROMETHAMINE 60 MG/2ML IM SOLN
60.0000 mg | Freq: Once | INTRAMUSCULAR | Status: AC
  Administered 2020-12-15: 60 mg via INTRAMUSCULAR

## 2020-12-15 MED ORDER — NAPROXEN 375 MG PO TABS: 375.0000 mg | ORAL_TABLET | Freq: Two times a day (BID) | ORAL | 0 refills | Status: DC

## 2020-12-15 MED ORDER — ALUM & MAG HYDROXIDE-SIMETH 200-200-20 MG/5ML PO SUSP
30.0000 mL | Freq: Once | ORAL | Status: AC
  Administered 2020-12-15: 30 mL via ORAL

## 2020-12-15 NOTE — ED Triage Notes (Signed)
Pt reporting lower mid abdominal pain, started this evening around 1830. Also having lower back pain. Pt took a home urine test that "looked like it may be positive" for UTI. Denies fevers. Has IUD. Nausea.

## 2020-12-15 NOTE — ED Provider Notes (Signed)
[facility] EMERGENCY DEPT Provider Note   CSN: [id number] Arrival date & time: 12/15/20  0034     History Chief Complaint  Patient presents with   Abdominal Pain    Debra Henry is a 25 y.o. female.  The history is provided by the patient.  Abdominal Pain Pain location:  Suprapubic Pain quality: cramping   Pain radiates to:  Does not radiate Pain severity:  Moderate Onset quality:  Sudden Duration:  8 hours Timing:  Constant Progression:  Waxing and waning Chronicity:  Recurrent Context: not laxative use, not sick contacts and not trauma   Context comment:  Doesn't keep track of her period but this feels like period cramps Relieved by:  Nothing Worsened by:  Nothing Ineffective treatments:  None tried Associated symptoms: no belching, no constipation, no diarrhea, no dysuria, no fever, no nausea, no shortness of breath, no vaginal bleeding, no vaginal discharge and no vomiting   Risk factors: no NSAID use and not pregnant       Past Medical History:  Diagnosis Date   Neutropenia [facility])     Patient Active Problem List   Diagnosis Date Noted   Right foot pain 08/02/2012    History reviewed. No pertinent surgical history.   OB History   No obstetric history on file.     Family History  Problem Relation Age of Onset   Healthy Mother    Healthy Father    Prostate cancer Paternal Uncle    Breast cancer Paternal Grandmother    Sudden death Neg Hx    Hypertension Neg Hx    Hyperlipidemia Neg Hx    Heart attack Neg Hx    Diabetes Neg Hx     Social History   Tobacco Use   Smoking status: Never   Smokeless tobacco: Never  Vaping Use   Vaping Use: Never used  Substance Use Topics   Alcohol use: Never    Home Medications Prior to Admission medications   Medication Sig Start Date End Date Taking? Authorizing Provider  levonorgestrel (KYLEENA) 19.5 MG IUD Kyleena 17.5 mcg/24 hrs (69yrs) 19.5mg  intrauterine device  Take 1 device by  intrauterine route.    [provider]    Allergies    Patient has no known allergies.  Review of Systems   Review of Systems  Constitutional:  Negative for fever.  HENT:  Negative for facial swelling.   Eyes:  Negative for redness.  Respiratory:  Negative for shortness of breath.   Cardiovascular:  Negative for leg swelling.  Gastrointestinal:  Positive for abdominal pain. Negative for constipation, diarrhea, nausea and vomiting.  Genitourinary:  Negative for dysuria, flank pain, vaginal bleeding and vaginal discharge.  Musculoskeletal:  Negative for neck pain.  Skin:  Negative for rash.  Neurological:  Negative for dizziness.  Psychiatric/Behavioral:  Negative for agitation.   All other systems reviewed and are negative.  Physical Exam Updated Vital Signs BP 133/78 (BP Location: Right Arm)   Pulse 89   Temp 98.3 F (36.8 C) (Oral)   Resp 16   Ht 5\' 7"  (1.702 m)   Wt 63.5 kg   SpO2 100%   BMI 21.93 kg/m   Physical Exam Vitals and nursing note reviewed.  Constitutional:      General: She is not in acute distress.    Appearance: Normal appearance.  HENT:     Head: Normocephalic and atraumatic.     Nose: Nose normal.  Eyes:     Conjunctiva/sclera: Conjunctivae normal.  Pupils: Pupils are equal, round, and reactive to light.  Cardiovascular:     Rate and Rhythm: Normal rate and regular rhythm.     Pulses: Normal pulses.     Heart sounds: Normal heart sounds.  Pulmonary:     Effort: Pulmonary effort is normal.     Breath sounds: Normal breath sounds.  Abdominal:     General: Abdomen is flat. There is no distension.     Palpations: Abdomen is soft. There is no mass.     Tenderness: There is no abdominal tenderness. There is no guarding or rebound.     Hernia: No hernia is present.     Comments: Gassy   Musculoskeletal:        General: Normal range of motion.     Cervical back: Normal range of motion and neck supple.  Skin:    General: Skin is warm  and dry.     Capillary Refill: Capillary refill takes less than 2 seconds.  Neurological:     General: No focal deficit present.     Mental Status: She is alert and oriented to person, place, and time.     Deep Tendon Reflexes: Reflexes normal.  Psychiatric:        Mood and Affect: Mood normal.        Behavior: Behavior normal.    ED Results / Procedures / Treatments   Labs (all labs ordered are listed, but only abnormal results are displayed) Results for orders placed or performed during the hospital encounter of 12/15/20  Lipase, blood  Result Value Ref Range   Lipase 68 (H) 11 - 51 U/L  Comprehensive metabolic panel  Result Value Ref Range   Sodium 140 135 - 145 mmol/L   Potassium 4.1 3.5 - 5.1 mmol/L   Chloride 104 98 - 111 mmol/L   CO2 26 22 - 32 mmol/L   Glucose, Bld 89 70 - 99 mg/dL   BUN 15 6 - 20 mg/dL   Creatinine, Ser 1.61 0.44 - 1.00 mg/dL   Calcium 9.7 8.9 - 09.6 mg/dL   Total Protein 7.0 6.5 - 8.1 g/dL   Albumin 4.7 3.5 - 5.0 g/dL   AST 16 15 - 41 U/L   ALT 10 0 - 44 U/L   Alkaline Phosphatase 68 38 - 126 U/L   Total Bilirubin 0.5 0.3 - 1.2 mg/dL   GFR, Estimated >04 >54 mL/min   Anion gap 10 5 - 15  CBC  Result Value Ref Range   WBC 4.2 4.0 - 10.5 K/uL   RBC 4.61 3.87 - 5.11 MIL/uL   Hemoglobin 13.6 12.0 - 15.0 g/dL   HCT 09.8 11.9 - 14.7 %   MCV 86.8 80.0 - 100.0 fL   MCH 29.5 26.0 - 34.0 pg   MCHC 34.0 30.0 - 36.0 g/dL   RDW 82.9 56.2 - 13.0 %   Platelets 255 150 - 400 K/uL   nRBC 0.0 0.0 - 0.2 %  Urinalysis, Routine w reflex microscopic Urine, Clean Catch  Result Value Ref Range   Color, Urine YELLOW YELLOW   APPearance CLEAR CLEAR   Specific Gravity, Urine 1.026 1.005 - 1.030   pH 6.5 5.0 - 8.0   Glucose, UA NEGATIVE NEGATIVE mg/dL   Hgb urine dipstick SMALL (A) NEGATIVE   Bilirubin Urine NEGATIVE NEGATIVE   Ketones, ur NEGATIVE NEGATIVE mg/dL   Protein, ur TRACE (A) NEGATIVE mg/dL   Nitrite NEGATIVE NEGATIVE   Leukocytes,Ua NEGATIVE  NEGATIVE   RBC /  HPF 0-5 0 - 5 RBC/hpf   WBC, UA 0-5 0 - 5 WBC/hpf   Bacteria, UA RARE (A) NONE SEEN   Squamous Epithelial / LPF 0-5 0 - 5   Mucus PRESENT   Pregnancy, urine  Result Value Ref Range   Preg Test, Ur NEGATIVE NEGATIVE   CT Renal Stone Study  Result Date: 12/15/2020 CLINICAL DATA:  Initial evaluation for acute flank pain, stone disease suspected. EXAM: CT ABDOMEN AND PELVIS WITHOUT CONTRAST TECHNIQUE: Multidetector CT imaging of the abdomen and pelvis was performed following the standard protocol without IV contrast. COMPARISON:  None. FINDINGS: Lower chest: Visualized lung bases are clear. Hepatobiliary: Limited noncontrast evaluation of the liver is unremarkable. Gallbladder contracted without acute finding. No early dilatation. Pancreas: Unremarkable. No pancreatic ductal dilatation or surrounding inflammatory changes. Spleen: Choose 1 Adrenals/Urinary Tract: Adrenal glands within normal limits. Kidneys equal in size without nephrolithiasis or hydronephrosis. No stone seen along the course of either renal collecting system. No hydroureter. No focal renal mass on this noncontrast examination. Partially distended bladder within normal limits. No layering stones within the bladder lumen. Stomach/Bowel: Stomach within normal limits. No evidence for bowel obstruction. Normal appendix. No acute inflammatory changes seen about the bowels. Vascular/Lymphatic: Intra-abdominal aorta of normal caliber. No adenopathy. Reproductive: IUD in place within the uterus. Uterus and ovaries within normal limits for age. Other: Small volume free fluid within the pelvic cul-de-sac, likely physiologic. No free air. Small fat containing paraumbilical hernia without inflammation. Musculoskeletal: No acute osseous finding. No discrete or worrisome osseous lesions. IMPRESSION: 1. No CT evidence for nephrolithiasis or obstructive uropathy. 2. No other acute intra-abdominal or pelvic process identified.  Electronically Signed   By: Rise Mu M.D.   On: 12/15/2020 02:32     Radiology CT Renal Stone Study  Result Date: 12/15/2020 CLINICAL DATA:  Initial evaluation for acute flank pain, stone disease suspected. EXAM: CT ABDOMEN AND PELVIS WITHOUT CONTRAST TECHNIQUE: Multidetector CT imaging of the abdomen and pelvis was performed following the standard protocol without IV contrast. COMPARISON:  None. FINDINGS: Lower chest: Visualized lung bases are clear. Hepatobiliary: Limited noncontrast evaluation of the liver is unremarkable. Gallbladder contracted without acute finding. No early dilatation. Pancreas: Unremarkable. No pancreatic ductal dilatation or surrounding inflammatory changes. Spleen: Choose 1 Adrenals/Urinary Tract: Adrenal glands within normal limits. Kidneys equal in size without nephrolithiasis or hydronephrosis. No stone seen along the course of either renal collecting system. No hydroureter. No focal renal mass on this noncontrast examination. Partially distended bladder within normal limits. No layering stones within the bladder lumen. Stomach/Bowel: Stomach within normal limits. No evidence for bowel obstruction. Normal appendix. No acute inflammatory changes seen about the bowels. Vascular/Lymphatic: Intra-abdominal aorta of normal caliber. No adenopathy. Reproductive: IUD in place within the uterus. Uterus and ovaries within normal limits for age. Other: Small volume free fluid within the pelvic cul-de-sac, likely physiologic. No free air. Small fat containing paraumbilical hernia without inflammation. Musculoskeletal: No acute osseous finding. No discrete or worrisome osseous lesions. IMPRESSION: 1. No CT evidence for nephrolithiasis or obstructive uropathy. 2. No other acute intra-abdominal or pelvic process identified. Electronically Signed   By: Rise Mu M.D.   On: 12/15/2020 02:32    Procedures Procedures   Medications Ordered in ED Medications  ketorolac  (TORADOL) injection 60 mg (60 mg Intramuscular Given 12/15/20 0354)  dicyclomine (BENTYL) capsule 10 mg (10 mg Oral Given 12/15/20 0354)  alum & mag hydroxide-simeth (MAALOX/MYLANTA) 200-200-20 MG/5ML suspension 30 mL (30 mLs Oral Given 12/15/20  [email or web address])    ED Course  I have reviewed the triage vital signs and the nursing notes.  Pertinent labs & imaging results that were available during my care of the patient were reviewed by me and considered in my medical decision making (see chart for details). Pain is suprapubic and not periumbilical.  I do not believe this is pancreatitis.  There is no abdominal tenderness.  CT and labs are benign as reassuring as is exam.  Patient is extremely comfortable in the room.  No vaginal bleeding or discharge.  Stable for discharge with close follow up.    Final Clinical Impression(s) / ED Diagnoses Final diagnoses:  None   Return for intractable cough, coughing up blood, fevers > 100.4 unrelieved by medication, shortness of breath, intractable vomiting, chest pain, shortness of breath, weakness, numbness, changes in speech, facial asymmetry, abdominal pain, passing out, Inability to tolerate liquids or food, cough, altered mental status or any concerns. No signs of systemic illness or infection. The patient is nontoxic-appearing on exam and vital signs are within normal limits.  I have reviewed the triage vital signs and the nursing notes. Pertinent labs & imaging results that were available during my care of the patient were reviewed by me and considered in my medical decision making (see chart for details). After history, exam, and medical workup I feel the patient has been appropriately medically screened and is safe for discharge home. Pertinent diagnoses were discussed with the patient. Patient was given return precautions.  Rx / DC Orders ED Discharge Orders     None        Ignatius Kloos, MD 12/15/20 0400

## 2020-12-16 ENCOUNTER — Telehealth: Payer: Self-pay | Admitting: *Deleted

## 2020-12-16 NOTE — Telephone Encounter (Signed)
TCT patient to follow up with her after she went to the ED yesterday for abdominal pain. No answer but was able to leave vm message for her to call back if she needed to.

## 2020-12-22 DIAGNOSIS — K5900 Constipation, unspecified: Secondary | ICD-10-CM | POA: Diagnosis not present

## 2021-02-23 DIAGNOSIS — D2261 Melanocytic nevi of right upper limb, including shoulder: Secondary | ICD-10-CM | POA: Diagnosis not present

## 2021-02-23 DIAGNOSIS — L700 Acne vulgaris: Secondary | ICD-10-CM | POA: Diagnosis not present

## 2021-02-23 DIAGNOSIS — D2262 Melanocytic nevi of left upper limb, including shoulder: Secondary | ICD-10-CM | POA: Diagnosis not present

## 2021-02-23 DIAGNOSIS — D225 Melanocytic nevi of trunk: Secondary | ICD-10-CM | POA: Diagnosis not present

## 2021-03-12 ENCOUNTER — Other Ambulatory Visit: Payer: Self-pay

## 2021-03-12 ENCOUNTER — Inpatient Hospital Stay: Payer: BC Managed Care – PPO | Attending: Hematology and Oncology

## 2021-03-12 ENCOUNTER — Inpatient Hospital Stay (HOSPITAL_BASED_OUTPATIENT_CLINIC_OR_DEPARTMENT_OTHER): Payer: BC Managed Care – PPO | Admitting: Hematology and Oncology

## 2021-03-12 VITALS — BP 111/67 | HR 89 | Temp 96.8°F | Resp 17 | Ht 67.0 in | Wt 141.8 lb

## 2021-03-12 DIAGNOSIS — R110 Nausea: Secondary | ICD-10-CM | POA: Diagnosis not present

## 2021-03-12 DIAGNOSIS — R197 Diarrhea, unspecified: Secondary | ICD-10-CM | POA: Insufficient documentation

## 2021-03-12 DIAGNOSIS — D709 Neutropenia, unspecified: Secondary | ICD-10-CM

## 2021-03-12 DIAGNOSIS — Z803 Family history of malignant neoplasm of breast: Secondary | ICD-10-CM | POA: Insufficient documentation

## 2021-03-12 DIAGNOSIS — Z8042 Family history of malignant neoplasm of prostate: Secondary | ICD-10-CM | POA: Diagnosis not present

## 2021-03-12 DIAGNOSIS — D72819 Decreased white blood cell count, unspecified: Secondary | ICD-10-CM | POA: Insufficient documentation

## 2021-03-12 LAB — CBC WITH DIFFERENTIAL (CANCER CENTER ONLY)
Abs Immature Granulocytes: 0 10*3/uL (ref 0.00–0.07)
Basophils Absolute: 0 10*3/uL (ref 0.0–0.1)
Basophils Relative: 0 %
Eosinophils Absolute: 0 10*3/uL (ref 0.0–0.5)
Eosinophils Relative: 1 %
HCT: 41.7 % (ref 36.0–46.0)
Hemoglobin: 14.1 g/dL (ref 12.0–15.0)
Immature Granulocytes: 0 %
Lymphocytes Relative: 38 %
Lymphs Abs: 1.3 10*3/uL (ref 0.7–4.0)
MCH: 28.8 pg (ref 26.0–34.0)
MCHC: 33.8 g/dL (ref 30.0–36.0)
MCV: 85.3 fL (ref 80.0–100.0)
Monocytes Absolute: 0.4 10*3/uL (ref 0.1–1.0)
Monocytes Relative: 13 %
Neutro Abs: 1.6 10*3/uL — ABNORMAL LOW (ref 1.7–7.7)
Neutrophils Relative %: 48 %
Platelet Count: 256 10*3/uL (ref 150–400)
RBC: 4.89 MIL/uL (ref 3.87–5.11)
RDW: 12.3 % (ref 11.5–15.5)
WBC Count: 3.4 10*3/uL — ABNORMAL LOW (ref 4.0–10.5)
nRBC: 0 % (ref 0.0–0.2)

## 2021-03-12 MED ORDER — ONDANSETRON HCL 4 MG PO TABS: 4.0000 mg | ORAL_TABLET | Freq: Three times a day (TID) | ORAL | 0 refills | Status: DC | PRN

## 2021-03-12 NOTE — Progress Notes (Signed)
[place] [phone]   Fax:([phone]  PROGRESS NOTE  Patient Care Team: Velna Hatchet, MD as PCP - General (Internal Medicine)  Hematological/Oncological History # Leukopenia 10/07/2019: WBC 3.6, Hgb 12.4, MCV 84.9, Plt 246. ANC 1.7 06/12/2020: WBC 2.3, Hgb 13.4, MCV 90.2, Plt 250, ANC 0.2 06/19/2020: WBC 2.14, Hgb 13.6, MCV 89.3, Plt 237, ANC 0.2 07/02/2020: establish care with Dr. Lorenso Courier. WBC 2.0, Hgb 13.3, MCV 87.1, Plt 253, ANC 0.1 07/08/2020: WBC 2.4, Hgb 12.3, Plt 231, ANC 0.3 67/2022: WBC 3.1 with ANC 2.3 after prednisone $RemoveBeforeDE'40mg'OuEtcmVLJfVPmut$  x 4 days 08/05/2020: WBC 2.0, ANC 0.1.  08/28/2020: WBC 4.1, Hgb 13.6, MCV 87.1, Plt 208. ANC 3.4 09/07/2020: WBC 3.5, Hgb 13.7, MCV 88.3, Plt 211. ANC 0.7 03/12/2021: WBC 3.4, Hgb 14.1, MCV 85.3, Plt 256, ANC 1.6  Interval History:  Debra Henry 26 y.o. female with medical history significant for severe neutropenia of unclear etiology presents for an urgent follow up visit. The patient's last visit was on 12/11/2020. In the interim since the last visit she has developed abdominal pain and diarrhea. Today ANC is 1.6  On exam today Debra Henry is unaccompanied.  She reports that she has been feeling "icky" this week.  She notes that she is having a lot of nausea throughout the day but no vomiting.  She overall has a "queasiness feeling".  She is also struggling with fatigue.  She reports that her appetite has been "okay" but that she is not eating all 3 meals per day.  She reports that she is having loose watery stools with abdominal discomfort.  She denies any blood in the stool and reports it is of normal color without foam.  She currently denies any fevers, chills, cough, or runny nose.  A full 10 point ROS is listed below.  MEDICAL HISTORY:  Past Medical History:  Diagnosis Date   Neutropenia ([place])     SURGICAL HISTORY: No past surgical history on file.  SOCIAL HISTORY: Social History   Socioeconomic History    Marital status: Single    Spouse name: Not on file   Number of children: 0   Years of education: Not on file   Highest education level: Not on file  Occupational History   Not on file  Tobacco Use   Smoking status: Never   Smokeless tobacco: Never  Vaping Use   Vaping Use: Never used  Substance and Sexual Activity   Alcohol use: Never   Drug use: Not on file   Sexual activity: Not on file  Other Topics Concern   Not on file  Social History Narrative   Not on file   Social Determinants of Health   Financial Resource Strain: Not on file  Food Insecurity: Not on file  Transportation Needs: Not on file  Physical Activity: Not on file  Stress: Not on file  Social Connections: Not on file  Intimate Partner Violence: Not on file    FAMILY HISTORY: Family History  Problem Relation Age of Onset   Healthy Mother    Healthy Father    Prostate cancer Paternal Uncle    Breast cancer Paternal Grandmother    Sudden death Neg Hx    Hypertension Neg Hx    Hyperlipidemia Neg Hx    Heart attack Neg Hx    Diabetes Neg Hx     ALLERGIES:  has No Known Allergies.  MEDICATIONS:  Current Outpatient Medications  Medication Sig Dispense Refill   ondansetron (ZOFRAN) 4 MG tablet  Take 1 tablet (4 mg total) by mouth every 8 (eight) hours as needed for nausea or vomiting. 20 tablet 0   dicyclomine (BENTYL) 20 MG tablet Take 1 tablet (20 mg total) by mouth 2 (two) times daily. 20 tablet 0   levonorgestrel (KYLEENA) 19.5 MG IUD Kyleena 17.5 mcg/24 hrs (50yr) 19.54mintrauterine device  Take 1 device by intrauterine route.     naproxen (NAPROSYN) 375 MG tablet Take 1 tablet (375 mg total) by mouth 2 (two) times daily with a meal. 10 tablet 0   No current facility-administered medications for this visit.    REVIEW OF SYSTEMS:   Constitutional: ( - ) fevers, ( - )  chills , ( - ) night sweats Eyes: ( - ) blurriness of vision, ( - ) double vision, ( - ) watery eyes Ears, nose, mouth,  throat, and face: ( - ) mucositis, ( - ) sore throat Respiratory: ( - ) cough, ( - ) dyspnea, ( - ) wheezes Cardiovascular: ( - ) palpitation, ( - ) chest discomfort, ( - ) lower extremity swelling Gastrointestinal:  ( - ) nausea, ( - ) heartburn, ( - ) change in bowel habits Skin: ( - ) abnormal skin rashes Lymphatics: ( - ) new lymphadenopathy, ( - ) easy bruising Neurological: ( - ) numbness, ( - ) tingling, ( - ) new weaknesses Behavioral/Psych: ( - ) mood change, ( - ) new changes  All other systems were reviewed with the patient and are negative.  PHYSICAL EXAMINATION: ECOG PERFORMANCE STATUS: 0 - Asymptomatic  Vitals:   03/12/21 1008  BP: 111/67  Pulse: 89  Resp: 17  Temp: (!) 96.8 F (36 C)  SpO2: 100%   Filed Weights   03/12/21 1008  Weight: 141 lb 12.8 oz (64.3 kg)    GENERAL: Well-appearing young Caucasian female, alert, no distress and comfortable SKIN: skin color, texture, turgor are normal, no rashes or significant lesions EYES: conjunctiva are pink and non-injected, sclera clear LUNGS: clear to auscultation and percussion with normal breathing effort HEART: regular rate & rhythm and no murmurs and no lower extremity edema Musculoskeletal: no cyanosis of digits and no clubbing  PSYCH: alert & oriented x 3, fluent speech NEURO: no focal motor/sensory deficits  LABORATORY DATA:  I have reviewed the data as listed CBC Latest Ref Rng & Units 03/12/2021 12/15/2020 12/11/2020  WBC 4.0 - 10.5 K/uL 3.4(L) 4.2 2.7(L)  Hemoglobin 12.0 - 15.0 g/dL 14.1 13.6 12.8  Hematocrit 36.0 - 46.0 % 41.7 40.0 38.4  Platelets 150 - 400 K/uL 256 255 212    CMP Latest Ref Rng & Units 12/15/2020 08/05/2020 07/13/2020  Glucose 70 - 99 mg/dL 89 86 121(H)  BUN 6 - 20 mg/dL _0 Creatinine 0.44 - 1.00 mg/dL 0.94 0.86 0.83  Sodium 135 - 145 mmol/L 140 140 141  Potassium 3.5 - 5.1 mmol/L 4.1 4.3 4.3  Chloride 98 - 111 mmol/L 104 106 107  CO2 22 - 32 mmol/L _1 Calcium 8.9 -  10.3 mg/dL 9.7 9.4 9.1  Total Protein 6.5 - 8.1 g/dL 7.0 7.3 6.8  Total Bilirubin 0.3 - 1.2 mg/dL 0.5 0.6 0.4  Alkaline Phos 38 - 126 U/L 68 78 79  AST 15 - 41 U/L 16 20 13(L)  ALT 0 - 44 U/L _2 RADIOGRAPHIC STUDIES: No results found.  ASSESSMENT & PLAN Debra Vanaken89.o. female with no significant past medical history who  presents for follow up of of neutropenia.    After review of the labs, review of the records, and discussion with the patient the patients findings are most consistent with a severe neutropenia of unclear etiology.  The patient has no focal symptoms which would lead [place] towards a possible cause.  Also the patient has had perfectly normal hemoglobin, platelet count, and MCV throughout this whole process.  She is virtually asymptomatic.  Our prior evaluation with nutritional and viral work-up were completely negative.  Bone marrow biopsy and review of peripheral blood film are also equally unrevealing.  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  We will attempt a steroid pulse to see if this will help to boost the patient's white blood cell count with a durable response.  This case has been discussed with Dr. Joan Mayans at [facility]  #Diarrhea #Nausea #Abdominal Discomfort -- Fortunately the patient's ANC is 1.6 today.  Her immune system appears adequate to fight off infection --Her symptoms appear most consistent with a GI virus.  Recommend conservative management with hydration and as needed antidiarrheals --We will prescribe the patient Zofran 4 mg p.o. daily to help with her nausea symptoms --Recommend calling our clinic if she were to have any new or worsening symptoms.   # Leukopenia # Severe Neutropenia -- negative viral work-up with HIV, hepatitis B, hepatitis C --negative nutritional work-up with vitamin B12, folate, homocystine, and methylmalonic acid. -CBC today shows an ANC of  1.6, improved from prior but she currently has a GI viral infection. --Bone marrow biopsy shows no evidence of abnormalities. Peripheral blood film shows rare atypical lymphocyte and near absent neutrophils.  --steroid taper temporarily improved ANC. It has dropped back down after steroids were d/c --Patient has been seen by Dr. Joan Mayans at [facility].  She agrees with our current management plan. --If we are unable to keep the patient's [place] elevated would recommend she monitor for bacterial infections and return to [place] if she would develop a temperature greater than 100.6 F.  We could administer G-CSF in attempt to increase her white blood cell count during infection. --Return to clinic as scheduled in May 2023.  No orders of the defined types were placed in this encounter.   All questions were answered. The patient knows to call the clinic with any problems, questions or concerns.  A total of more than 30 minutes were spent on this encounter with face-to-face time and non-face-to-face time, including preparing to see the patient, ordering tests and/or medications, counseling the patient and coordination of care as outlined above.   Ledell Peoples, MD Department of Hematology/Oncology [place] at [facility] Phone: [phone] Pager: [phone] Email: [email or web address]  03/15/2021 9:10 AM

## 2021-03-17 ENCOUNTER — Telehealth: Payer: Self-pay | Admitting: *Deleted

## 2021-03-17 NOTE — Telephone Encounter (Signed)
Received call from patient. She was seen last week with GI issues-nausea and some diarrhea. She states she does not feel any better this week. Persistent nausea every day.  The Zofran is not helping.She states the diarrhea has slowed down  considerably.  Advised that she should contact her PCP. She states that his office is closed due to a broken water pipe. Advised to go to Urgent Care then. Provided information on [facility] on [facility]. Pt voiced understanding. ?Advised , per Dr. [name], that she can take 2 Zofran tabs for nausea for a total of 8 mg every 8 hours. Pt voiced understanding. Advised to call with any updates. She said she would. ?

## 2021-03-19 DIAGNOSIS — R110 Nausea: Secondary | ICD-10-CM | POA: Diagnosis not present

## 2021-04-01 DIAGNOSIS — R1030 Lower abdominal pain, unspecified: Secondary | ICD-10-CM | POA: Diagnosis not present

## 2021-04-01 DIAGNOSIS — K219 Gastro-esophageal reflux disease without esophagitis: Secondary | ICD-10-CM | POA: Diagnosis not present

## 2021-05-31 DIAGNOSIS — D709 Neutropenia, unspecified: Secondary | ICD-10-CM | POA: Diagnosis not present

## 2021-05-31 DIAGNOSIS — D708 Other neutropenia: Secondary | ICD-10-CM | POA: Diagnosis not present

## 2021-06-11 ENCOUNTER — Inpatient Hospital Stay: Payer: BC Managed Care – PPO | Admitting: Hematology and Oncology

## 2021-06-11 ENCOUNTER — Inpatient Hospital Stay: Payer: BC Managed Care – PPO

## 2021-06-21 DIAGNOSIS — Z130 Encounter for screening for diseases of the blood and blood-forming organs and certain disorders involving the immune mechanism: Secondary | ICD-10-CM | POA: Diagnosis not present

## 2021-06-21 DIAGNOSIS — Z01419 Encounter for gynecological examination (general) (routine) without abnormal findings: Secondary | ICD-10-CM | POA: Diagnosis not present

## 2021-06-21 DIAGNOSIS — Z1151 Encounter for screening for human papillomavirus (HPV): Secondary | ICD-10-CM | POA: Diagnosis not present

## 2021-06-21 DIAGNOSIS — Z113 Encounter for screening for infections with a predominantly sexual mode of transmission: Secondary | ICD-10-CM | POA: Diagnosis not present

## 2021-06-21 DIAGNOSIS — Z124 Encounter for screening for malignant neoplasm of cervix: Secondary | ICD-10-CM | POA: Diagnosis not present

## 2021-07-28 DIAGNOSIS — J0100 Acute maxillary sinusitis, unspecified: Secondary | ICD-10-CM | POA: Diagnosis not present

## 2021-08-18 DIAGNOSIS — E785 Hyperlipidemia, unspecified: Secondary | ICD-10-CM | POA: Diagnosis not present

## 2021-08-18 DIAGNOSIS — R5383 Other fatigue: Secondary | ICD-10-CM | POA: Diagnosis not present

## 2021-08-24 ENCOUNTER — Telehealth: Payer: Self-pay | Admitting: Hematology and Oncology

## 2021-08-24 NOTE — Telephone Encounter (Signed)
Per 8/8 phone line pt called to r/s appointment  r/s per pt request  

## 2021-08-25 DIAGNOSIS — Z1331 Encounter for screening for depression: Secondary | ICD-10-CM | POA: Diagnosis not present

## 2021-08-25 DIAGNOSIS — Z0000 Encounter for general adult medical examination without abnormal findings: Secondary | ICD-10-CM | POA: Diagnosis not present

## 2021-08-25 DIAGNOSIS — D72819 Decreased white blood cell count, unspecified: Secondary | ICD-10-CM | POA: Diagnosis not present

## 2021-08-25 DIAGNOSIS — Z1339 Encounter for screening examination for other mental health and behavioral disorders: Secondary | ICD-10-CM | POA: Diagnosis not present

## 2021-08-26 ENCOUNTER — Inpatient Hospital Stay: Payer: BC Managed Care – PPO

## 2021-08-26 ENCOUNTER — Inpatient Hospital Stay: Payer: BC Managed Care – PPO | Admitting: Hematology and Oncology

## 2021-10-08 ENCOUNTER — Inpatient Hospital Stay (HOSPITAL_BASED_OUTPATIENT_CLINIC_OR_DEPARTMENT_OTHER): Payer: BC Managed Care – PPO | Admitting: Hematology and Oncology

## 2021-10-08 ENCOUNTER — Inpatient Hospital Stay: Payer: BC Managed Care – PPO | Attending: Hematology and Oncology

## 2021-10-08 ENCOUNTER — Other Ambulatory Visit: Payer: Self-pay | Admitting: Hematology and Oncology

## 2021-10-08 ENCOUNTER — Other Ambulatory Visit: Payer: Self-pay

## 2021-10-08 VITALS — BP 108/72 | HR 68 | Temp 98.4°F | Resp 14 | Wt 152.9 lb

## 2021-10-08 DIAGNOSIS — D709 Neutropenia, unspecified: Secondary | ICD-10-CM

## 2021-10-08 DIAGNOSIS — Z8042 Family history of malignant neoplasm of prostate: Secondary | ICD-10-CM | POA: Insufficient documentation

## 2021-10-08 DIAGNOSIS — Z803 Family history of malignant neoplasm of breast: Secondary | ICD-10-CM | POA: Diagnosis not present

## 2021-10-08 LAB — CBC WITH DIFFERENTIAL (CANCER CENTER ONLY)
Abs Immature Granulocytes: 0 10*3/uL (ref 0.00–0.07)
Basophils Absolute: 0 10*3/uL (ref 0.0–0.1)
Basophils Relative: 0 %
Eosinophils Absolute: 0 10*3/uL (ref 0.0–0.5)
Eosinophils Relative: 2 %
HCT: 36.6 % (ref 36.0–46.0)
Hemoglobin: 12.7 g/dL (ref 12.0–15.0)
Immature Granulocytes: 0 %
Lymphocytes Relative: 61 %
Lymphs Abs: 1.6 10*3/uL (ref 0.7–4.0)
MCH: 29.7 pg (ref 26.0–34.0)
MCHC: 34.7 g/dL (ref 30.0–36.0)
MCV: 85.5 fL (ref 80.0–100.0)
Monocytes Absolute: 0.4 10*3/uL (ref 0.1–1.0)
Monocytes Relative: 15 %
Neutro Abs: 0.6 10*3/uL — ABNORMAL LOW (ref 1.7–7.7)
Neutrophils Relative %: 22 %
Platelet Count: 239 10*3/uL (ref 150–400)
RBC: 4.28 MIL/uL (ref 3.87–5.11)
RDW: 12.1 % (ref 11.5–15.5)
WBC Count: 2.6 10*3/uL — ABNORMAL LOW (ref 4.0–10.5)
nRBC: 0 % (ref 0.0–0.2)

## 2021-10-08 LAB — CMP (CANCER CENTER ONLY)
ALT: 10 U/L (ref 0–44)
AST: 15 U/L (ref 15–41)
Albumin: 4.4 g/dL (ref 3.5–5.0)
Alkaline Phosphatase: 61 U/L (ref 38–126)
Anion gap: 2 — ABNORMAL LOW (ref 5–15)
BUN: 14 mg/dL (ref 6–20)
CO2: 31 mmol/L (ref 22–32)
Calcium: 9.2 mg/dL (ref 8.9–10.3)
Chloride: 104 mmol/L (ref 98–111)
Creatinine: 0.89 mg/dL (ref 0.44–1.00)
GFR, Estimated: >60 mL/min (ref >60)
Glucose, Bld: 98 mg/dL (ref 70–99)
Potassium: 3.9 mmol/L (ref 3.5–5.1)
Sodium: 137 mmol/L (ref 135–145)
Total Bilirubin: 0.4 mg/dL (ref 0.3–1.2)
Total Protein: 6.6 g/dL (ref 6.5–8.1)

## 2021-10-11 NOTE — Progress Notes (Signed)
[place] Telephone:(336) [phone]   Fax:(336) [phone]  PROGRESS NOTE  Patient Care Team: Velna Hatchet, MD as PCP - General (Internal Medicine)  Hematological/Oncological History # Leukopenia 10/07/2019: WBC 3.6, Hgb 12.4, MCV 84.9, Plt 246. ANC 1.7 06/12/2020: WBC 2.3, Hgb 13.4, MCV 90.2, Plt 250, ANC 0.2 06/19/2020: WBC 2.14, Hgb 13.6, MCV 89.3, Plt 237, ANC 0.2 07/02/2020: establish care with Dr. Lorenso Courier. WBC 2.0, Hgb 13.3, MCV 87.1, Plt 253, ANC 0.1 07/08/2020: WBC 2.4, Hgb 12.3, Plt 231, ANC 0.3 67/2022: WBC 3.1 with ANC 2.3 after prednisone 92m x 4 days 08/05/2020: WBC 2.0, ANC 0.1.  08/28/2020: WBC 4.1, Hgb 13.6, MCV 87.1, Plt 208. ANC 3.4 09/07/2020: WBC 3.5, Hgb 13.7, MCV 88.3, Plt 211. ANC 0.7 03/12/2021: WBC 3.4, Hgb 14.1, MCV 85.3, Plt 256, ANC 1.6  Interval History:  Debra Kaseman26y.o. female with medical history significant for severe neutropenia of unclear etiology presents for an urgent follow up visit. The patient's last visit was on 03/12/2021. In the interim since the last visit she has developed abdominal pain and diarrhea. Today ANC is 0.6  On exam today Debra Henry unaccompanied.  She reports she has been well in the interim since her last visit.  She reports that she is not having any issues with fevers, chills, sweats.  She has had no sinus issues.  She is also not had any infections with COVID or the flu.  She reports that her energy is good though occasionally she has issues with sleeping.  She has been eating well and her appetite is strong.  She currently denies any fevers, chills, cough, or runny nose.  A full 10 point ROS is listed below.  MEDICAL HISTORY:  Past Medical History:  Diagnosis Date   [place]     SURGICAL HISTORY: No past surgical history on file.  SOCIAL HISTORY: Social History   Socioeconomic History   Marital status: Single    Spouse name: Not on file   Number of children: 0   Years of education: Not on  file   Highest education level: Not on file  Occupational History   Not on file  Tobacco Use   Smoking status: Never   Smokeless tobacco: Never  Vaping Use   Vaping Use: Never used  Substance and Sexual Activity   Alcohol use: Never   Drug use: Not on file   Sexual activity: Not on file  Other Topics Concern   Not on file  Social History Narrative   Not on file   Social Determinants of Health   Financial Resource Strain: Not on file  Food Insecurity: Not on file  Transportation Needs: Not on file  Physical Activity: Not on file  Stress: Not on file  Social Connections: Not on file  Intimate Partner Violence: Not on file    FAMILY HISTORY: Family History  Problem Relation Age of Onset   Healthy Mother    Healthy Father    Prostate cancer Paternal Uncle    Breast cancer Paternal Grandmother    Sudden death Neg Hx    Hypertension Neg Hx    Hyperlipidemia Neg Hx    Heart attack Neg Hx    Diabetes Neg Hx     ALLERGIES:  has No Known Allergies.  MEDICATIONS:  Current Outpatient Medications  Medication Sig Dispense Refill   dicyclomine (BENTYL) 20 MG tablet Take 1 tablet (20 mg total) by mouth 2 (two) times daily. 20 tablet 0   levonorgestrel (  KYLEENA) 19.5 MG IUD Kyleena 17.5 mcg/24 hrs (55yr) 19.594mintrauterine device  Take 1 device by intrauterine route.     naproxen (NAPROSYN) 375 MG tablet Take 1 tablet (375 mg total) by mouth 2 (two) times daily with a meal. 10 tablet 0   ondansetron (ZOFRAN) 4 MG tablet Take 1 tablet (4 mg total) by mouth every 8 (eight) hours as needed for nausea or vomiting. 20 tablet 0   No current facility-administered medications for this visit.    REVIEW OF SYSTEMS:   Constitutional: ( - ) fevers, ( - )  chills , ( - ) night sweats Eyes: ( - ) blurriness of vision, ( - ) double vision, ( - ) watery eyes Ears, nose, mouth, throat, and face: ( - ) mucositis, ( - ) sore throat Respiratory: ( - ) cough, ( - ) dyspnea, ( - )  wheezes Cardiovascular: ( - ) palpitation, ( - ) chest discomfort, ( - ) lower extremity swelling Gastrointestinal:  ( - ) nausea, ( - ) heartburn, ( - ) change in bowel habits Skin: ( - ) abnormal skin rashes Lymphatics: ( - ) new lymphadenopathy, ( - ) easy bruising Neurological: ( - ) numbness, ( - ) tingling, ( - ) new weaknesses Behavioral/Psych: ( - ) mood change, ( - ) new changes  All other systems were reviewed with the patient and are negative.  PHYSICAL EXAMINATION: ECOG PERFORMANCE STATUS: 0 - Asymptomatic  Vitals:   10/08/21 1541  BP: 108/72  Pulse: 68  Resp: 14  Temp: 98.4 F (36.9 C)  SpO2: 100%   Filed Weights   10/08/21 1541  Weight: 152 lb 14.4 oz (69.4 kg)    GENERAL: Well-appearing young Caucasian female, alert, no distress and comfortable SKIN: skin color, texture, turgor are normal, no rashes or significant lesions EYES: conjunctiva are pink and non-injected, sclera clear LUNGS: clear to auscultation and percussion with normal breathing effort HEART: regular rate & rhythm and no murmurs and no lower extremity edema Musculoskeletal: no cyanosis of digits and no clubbing  PSYCH: alert & oriented x 3, fluent speech NEURO: no focal motor/sensory deficits  LABORATORY DATA:  I have reviewed the data as listed    Latest Ref Rng & Units 10/08/2021    3:14 PM 03/12/2021    9:45 AM 12/15/2020   12:48 AM  CBC  WBC 4.0 - 10.5 K/uL 2.6  3.4  4.2   Hemoglobin 12.0 - 15.0 g/dL 12.7  14.1  13.6   Hematocrit 36.0 - 46.0 % 36.6  41.7  40.0   Platelets 150 - 400 K/uL 239  256  255        Latest Ref Rng & Units 10/08/2021    3:14 PM 12/15/2020   12:48 AM 08/05/2020    8:28 AM  CMP  Glucose 70 - 99 mg/dL 98  89  86   BUN 6 - 20 mg/dL _0 Creatinine 0.44 - 1.00 mg/dL 0.89  0.94  0.86   Sodium 135 - 145 mmol/L 137  140  140   Potassium 3.5 - 5.1 mmol/L 3.9  4.1  4.3   Chloride 98 - 111 mmol/L 104  104  106   CO2 22 - 32 mmol/L _1 Calcium 8.9  - 10.3 mg/dL 9.2  9.7  9.4   Total Protein 6.5 - 8.1 g/dL 6.6  7.0  7.3   Total Bilirubin 0.3 - 1.2  mg/dL 0.4  0.5  0.6   Alkaline Phos 38 - 126 U/L 61  68  78   AST 15 - 41 U/L _0 ALT 0 - 44 U/L _1 RADIOGRAPHIC STUDIES: No results found.  ASSESSMENT & PLAN Debra Henry 26 y.o. female with no significant past medical history who presents for follow up of of neutropenia.    After review of the labs, review of the records, and discussion with the patient the patients findings are most consistent with a severe neutropenia of unclear etiology.  The patient has no focal symptoms which would lead [place] towards a possible cause.  Also the patient has had perfectly normal hemoglobin, platelet count, and MCV throughout this whole process.  She is virtually asymptomatic.  Our prior evaluation with nutritional and viral work-up were completely negative.  Bone marrow biopsy and review of peripheral blood film are also equally unrevealing.  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  We will attempt a steroid pulse to see if this will help to boost the patient's white blood cell count with a durable response.  This case has been discussed with Dr. Joan Mayans at [facility].    # Leukopenia # Severe Neutropenia of Unclear Etiology -- negative viral work-up with HIV, hepatitis B, hepatitis C --negative nutritional work-up with vitamin B12, folate, homocystine, and methylmalonic acid. -CBC today shows an ANC of 0.6, white blood cell 2.6, hemoglobin 12.7, MCV 85.5, and platelets of 239 --Bone marrow biopsy shows no evidence of abnormalities. Peripheral blood film shows rare atypical lymphocyte and near absent neutrophils.  --steroid taper temporarily improved ANC. It has dropped back down after steroids were d/c --Patient has been seen by Dr. Joan Mayans at [facility].  She agrees with our current management  plan. --If we are unable to keep the patient's [place] elevated would recommend she monitor for bacterial infections and return to [place] if she would develop a temperature greater than 100.6 F.  We could administer G-CSF in attempt to increase her white blood cell count during infection. --Return to clinic in 12 months time with interval q 3 month labs.  Orders Placed This Encounter  Procedures   CBC with Differential ([place] Only)    Standing Status:   Standing    Number of Occurrences:   4    Standing Expiration Date:   10/12/2022    All questions were answered. The patient knows to call the clinic with any problems, questions or concerns.  A total of more than 25 minutes were spent on this encounter with face-to-face time and non-face-to-face time, including preparing to see the patient, ordering tests and/or medications, counseling the patient and coordination of care as outlined above.   Ledell Peoples, MD Department of Hematology/Oncology [place] at [facility] Phone: [phone] Pager: [phone] Email: [email or web address]  10/11/2021 7:30 AM

## 2021-12-30 ENCOUNTER — Other Ambulatory Visit: Payer: Self-pay | Admitting: *Deleted

## 2021-12-30 ENCOUNTER — Telehealth: Payer: Self-pay | Admitting: *Deleted

## 2021-12-30 DIAGNOSIS — D709 Neutropenia, unspecified: Secondary | ICD-10-CM

## 2021-12-30 NOTE — Telephone Encounter (Signed)
Received call from pt requesting that her labs be done @ her work place. Advised that we could do that. Advised that I will fax her lab orders to [facility]. She provided fax #[phone]  Lab appt for 01/03/22 cancelled

## 2022-01-03 ENCOUNTER — Inpatient Hospital Stay: Payer: BC Managed Care – PPO

## 2022-04-04 ENCOUNTER — Inpatient Hospital Stay: Payer: BC Managed Care – PPO | Attending: Internal Medicine

## 2022-07-04 ENCOUNTER — Telehealth: Payer: Self-pay | Admitting: Hematology and Oncology

## 2022-07-04 NOTE — Telephone Encounter (Signed)
Patient aware of rescheduled appointment dates/times.

## 2022-07-05 ENCOUNTER — Inpatient Hospital Stay: Payer: BC Managed Care – PPO

## 2022-07-13 ENCOUNTER — Telehealth: Payer: Self-pay | Admitting: *Deleted

## 2022-07-13 ENCOUNTER — Inpatient Hospital Stay: Payer: BC Managed Care – PPO | Attending: Hematology and Oncology

## 2022-07-13 ENCOUNTER — Other Ambulatory Visit: Payer: Self-pay

## 2022-07-13 DIAGNOSIS — D709 Neutropenia, unspecified: Secondary | ICD-10-CM | POA: Insufficient documentation

## 2022-07-13 LAB — CBC WITH DIFFERENTIAL (CANCER CENTER ONLY)
Abs Immature Granulocytes: 0 10*3/uL (ref 0.00–0.07)
Basophils Absolute: 0 10*3/uL (ref 0.0–0.1)
Basophils Relative: 1 %
Eosinophils Absolute: 0 10*3/uL (ref 0.0–0.5)
Eosinophils Relative: 3 %
HCT: 40.1 % (ref 36.0–46.0)
Hemoglobin: 14.3 g/dL (ref 12.0–15.0)
Immature Granulocytes: 0 %
Lymphocytes Relative: 73 %
Lymphs Abs: 1.2 10*3/uL (ref 0.7–4.0)
MCH: 30.6 pg (ref 26.0–34.0)
MCHC: 35.7 g/dL (ref 30.0–36.0)
MCV: 85.9 fL (ref 80.0–100.0)
Monocytes Absolute: 0.3 10*3/uL (ref 0.1–1.0)
Monocytes Relative: 17 %
Neutro Abs: 0.1 10*3/uL — CL (ref 1.7–7.7)
Neutrophils Relative %: 6 %
Platelet Count: 275 10*3/uL (ref 150–400)
RBC: 4.67 MIL/uL (ref 3.87–5.11)
RDW: 12.4 % (ref 11.5–15.5)
Smear Review: NORMAL
WBC Count: 1.6 10*3/uL — ABNORMAL LOW (ref 4.0–10.5)
nRBC: 0 % (ref 0.0–0.2)

## 2022-07-13 LAB — CMP (CANCER CENTER ONLY)
ALT: 13 U/L (ref 0–44)
AST: 19 U/L (ref 15–41)
Albumin: 4.4 g/dL (ref 3.5–5.0)
Alkaline Phosphatase: 71 U/L (ref 38–126)
Anion gap: 7 (ref 5–15)
BUN: 12 mg/dL (ref 6–20)
CO2: 26 mmol/L (ref 22–32)
Calcium: 9.6 mg/dL (ref 8.9–10.3)
Chloride: 107 mmol/L (ref 98–111)
Creatinine: 0.8 mg/dL (ref 0.44–1.00)
GFR, Estimated: >60 mL/min (ref >60)
Glucose, Bld: 95 mg/dL (ref 70–99)
Potassium: 4.1 mmol/L (ref 3.5–5.1)
Sodium: 140 mmol/L (ref 135–145)
Total Bilirubin: 0.6 mg/dL (ref 0.3–1.2)
Total Protein: 7.4 g/dL (ref 6.5–8.1)

## 2022-07-13 NOTE — Telephone Encounter (Signed)
cri 

## 2022-07-13 NOTE — Telephone Encounter (Signed)
CRITICAL VALUE STICKER  CRITICAL VALUE:  ANC 0.1  RECEIVER (on-site recipient of call): Binnie Rail, RN  DATE & TIME NOTIFIED: 07/13/22 10:15 am  MESSENGER (representative from lab): Hilda Lias  MD NOTIFIED:  Dr. [name]  TIME OF NOTIFICATION: 07/13/22  10:30 am  RESPONSE:  No intervention needed Pt. Made aware.

## 2022-07-22 ENCOUNTER — Telehealth: Payer: Self-pay | Admitting: *Deleted

## 2022-07-22 DIAGNOSIS — D709 Neutropenia, unspecified: Secondary | ICD-10-CM

## 2022-07-22 NOTE — Telephone Encounter (Signed)
Received call from pt requesting GI referral for ongoing diarrhea. Referral sent to [facility], Dr. Norwood Levo

## 2022-08-03 ENCOUNTER — Encounter: Payer: Self-pay | Admitting: Internal Medicine

## 2022-08-23 DIAGNOSIS — L249 Irritant contact dermatitis, unspecified cause: Secondary | ICD-10-CM | POA: Diagnosis not present

## 2022-09-18 IMAGING — CT CT BIOPSY AND ASPIRATION BONE MARROW
1 of 2 series · 15 of 32 positions shown, 19 images · non-contrast
Comparison: none

INDICATION: Neutropenia of uncertain etiology. Please perform CT-guided marrow
biopsy tissue diagnostic purposes.

[Series 3: i-spiral 5.0 bf37 · axial · 0.73mm/px · z∈[-193,-70]mm · 15 of 39 slices shown, 19 images]
[im 2/39  soft-tissue]
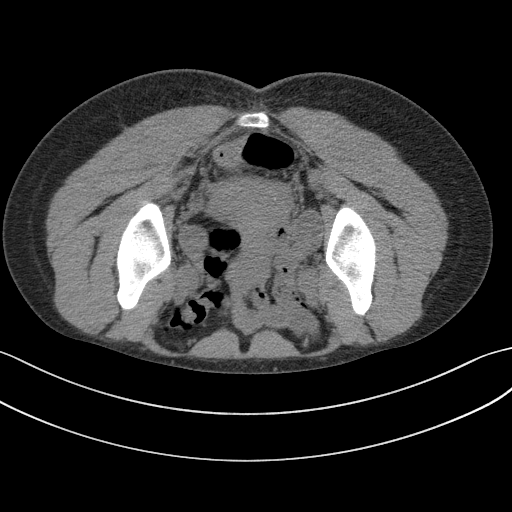
[im 2/39  bone]
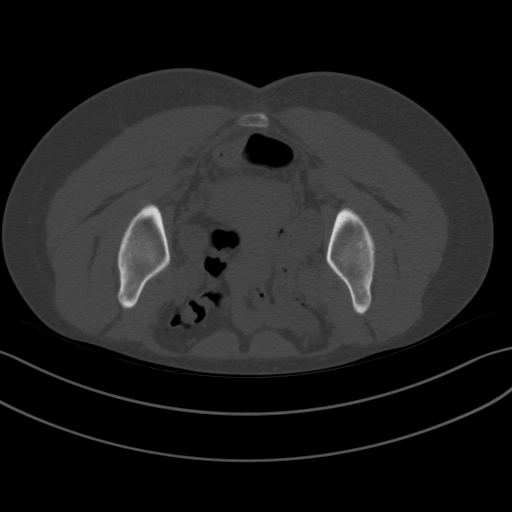
[im 5/39  soft-tissue]
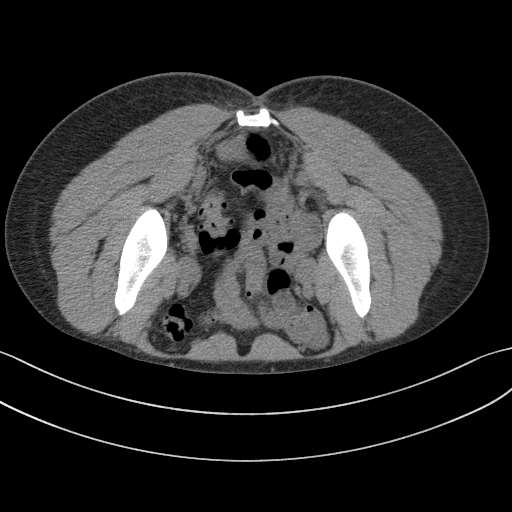
[im 8/39  soft-tissue]
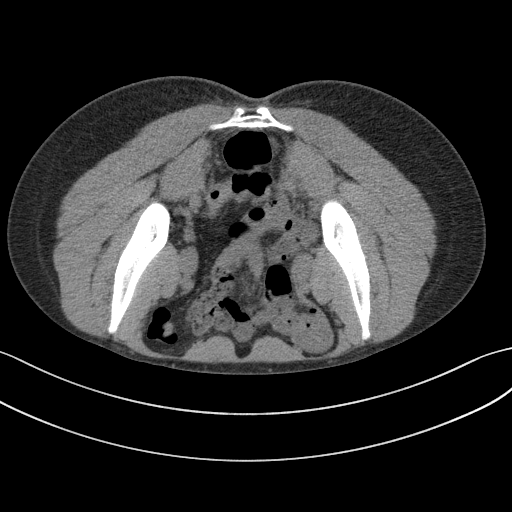
[im 11/39  soft-tissue]
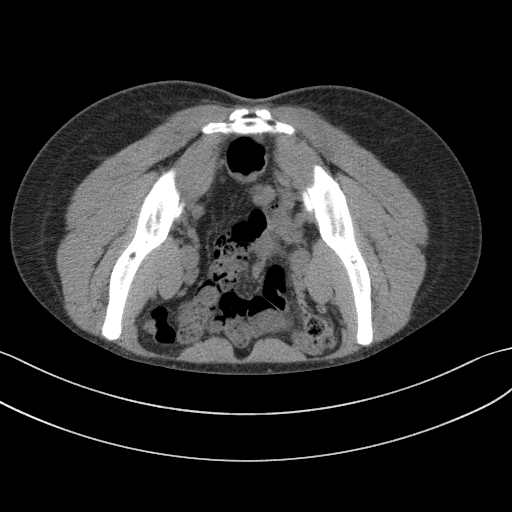
[im 14/39  soft-tissue]
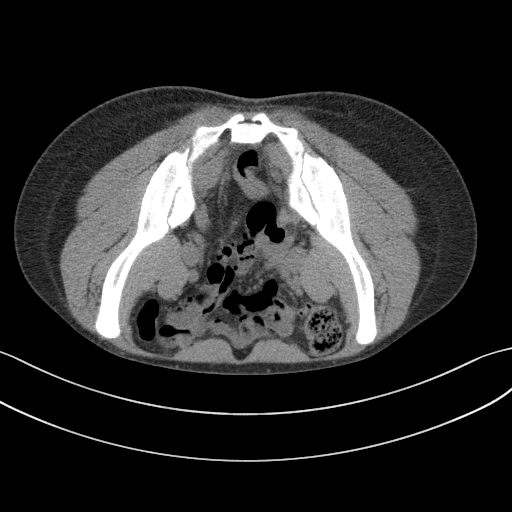
[im 17/39  soft-tissue]
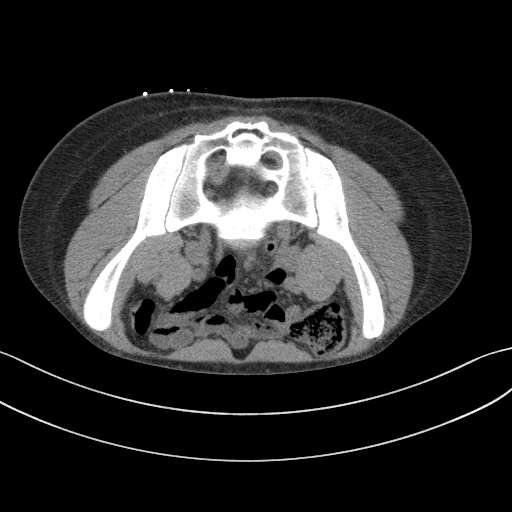
[im 20/39  soft-tissue]
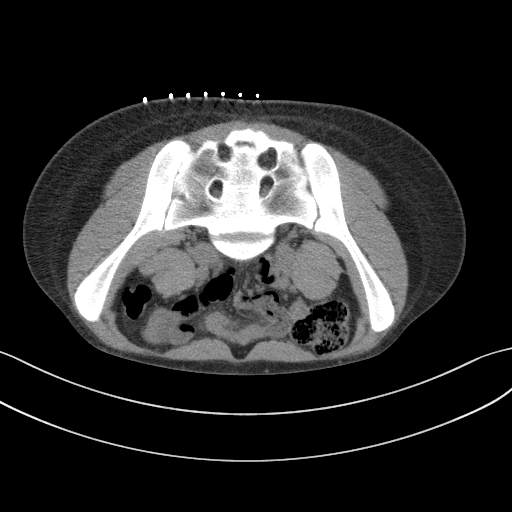
[im 22/39  soft-tissue]
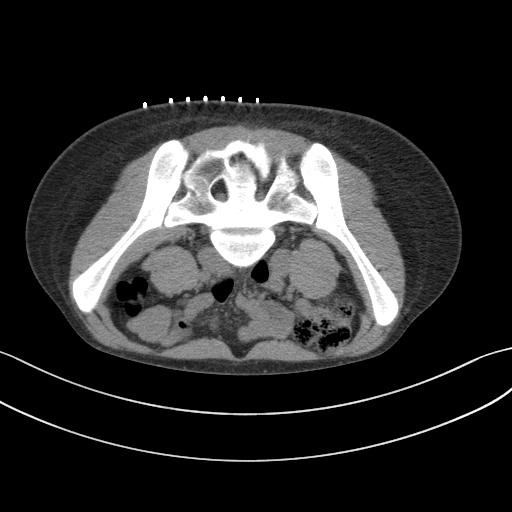
[im 25/39  soft-tissue]
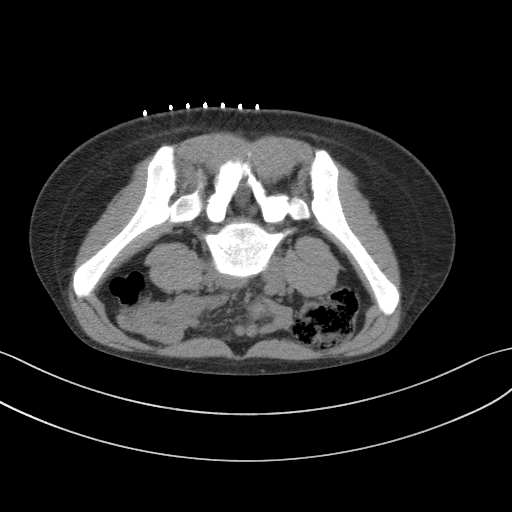
[im 25/39  bone]
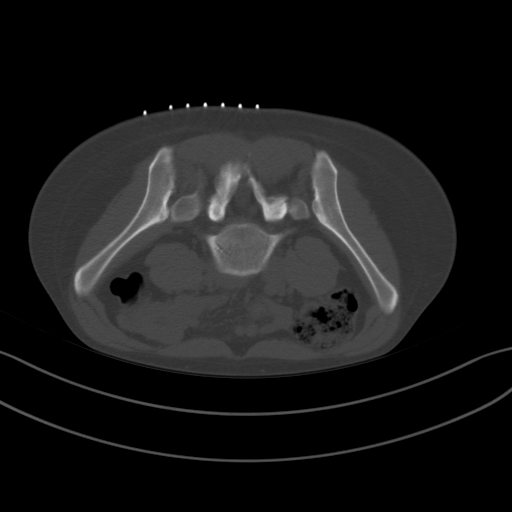
[im 28/39  soft-tissue]
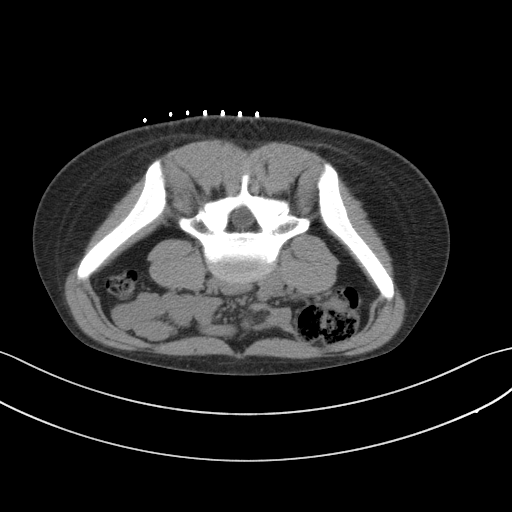
[im 31/39  soft-tissue]
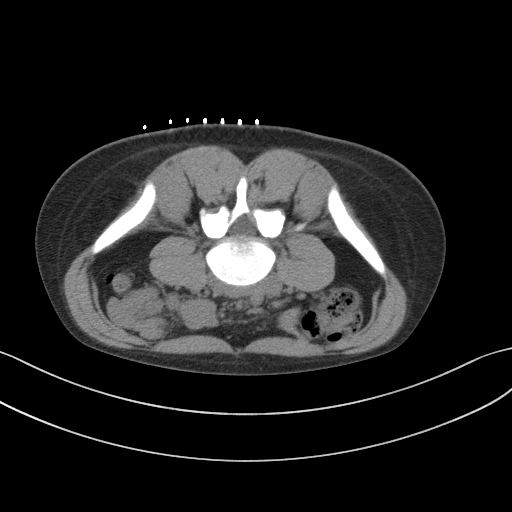
[im 32/39  lung]
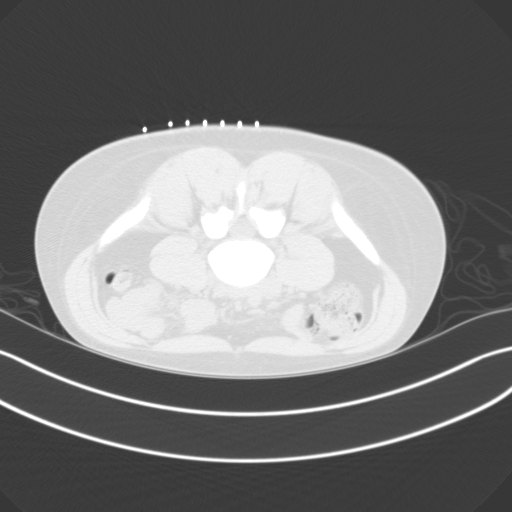
[im 34/39  soft-tissue]
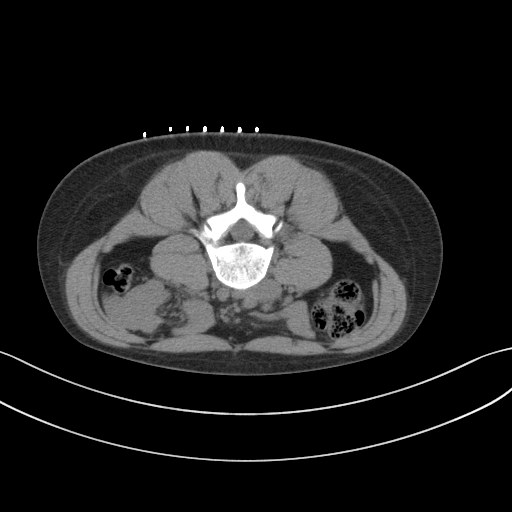
[im 34/39  lung]
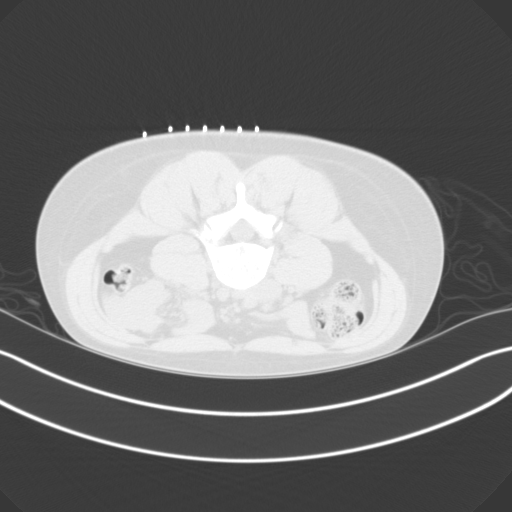
[im 35/39  lung]
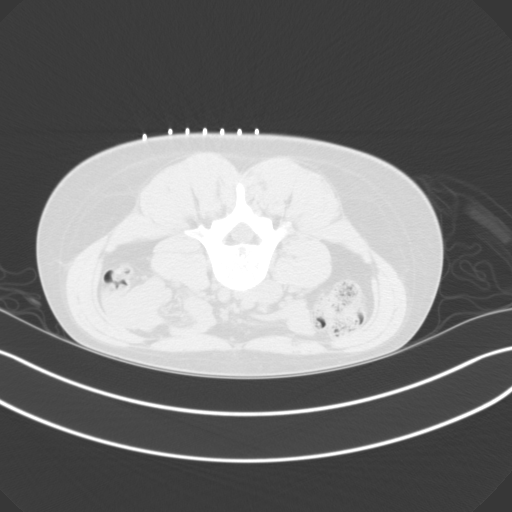
[im 37/39  soft-tissue]
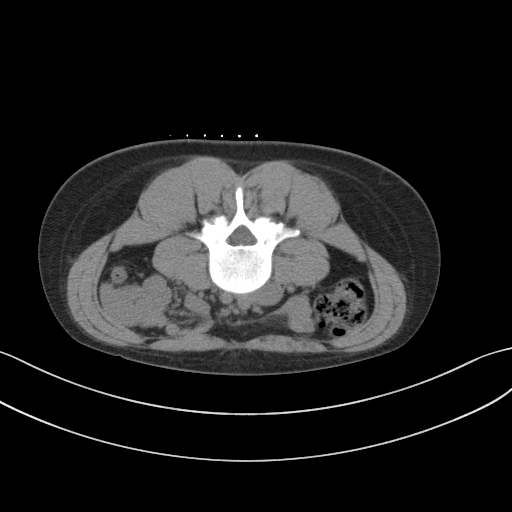
[im 37/39  lung]
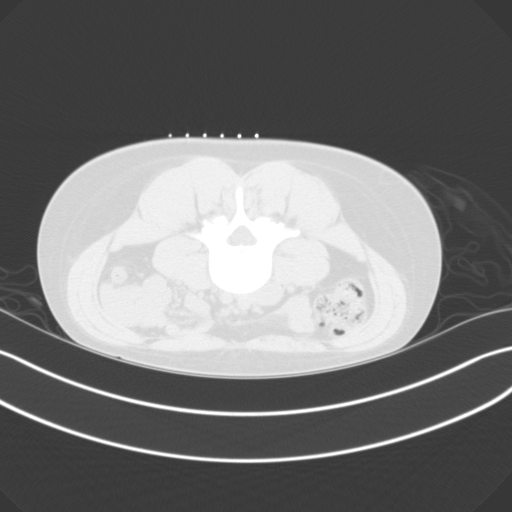

[15 of 32 positions shown; findings below may reference images not displayed]

EXAM:
CT-GUIDED BONE MARROW BIOPSY AND ASPIRATION

MEDICATIONS:
None

ANESTHESIA/SEDATION:
Fentanyl 100 mcg IV; Versed 4 mg IV

Sedation Time: 14 Minutes; The patient was continuously monitored
during the procedure by the interventional radiology nurse under my
direct supervision.

COMPLICATIONS:
None immediate.

PROCEDURE:
Informed consent was obtained from the patient following an
explanation of the procedure, risks, benefits and alternatives. The
patient understands, agrees and consents for the procedure. All
questions were addressed. A time out was performed prior to the
initiation of the procedure.

The patient was positioned prone and non-contrast localization CT
was performed of the pelvis to demonstrate the iliac marrow spaces.
The operative site was prepped and draped in the usual sterile
fashion.

Under sterile conditions and local anesthesia, a 22 gauge spinal
needle was utilized for procedural planning. Next, an 11 gauge
coaxial bone biopsy needle was advanced into the left iliac marrow
space. Needle position was confirmed with CT imaging. Initially, a
bone marrow aspiration was performed. Next, a bone marrow biopsy was
obtained with the 11 gauge outer bone marrow device. The 11 gauge
coaxial bone biopsy needle was re-advanced into a slightly different
location within the left iliac marrow space, positioning was
confirmed with CT imaging and an additional bone marrow biopsy was
obtained. Samples were prepared with the cytotechnologist and deemed
adequate. The needle was removed and superficial hemostasis was
obtained with manual compression. A dressing was applied. The
patient tolerated the procedure well without immediate post
procedural complication.
IMPRESSION: Successful CT guided left iliac bone marrow aspiration and core
biopsy.

## 2022-09-28 ENCOUNTER — Encounter (HOSPITAL_BASED_OUTPATIENT_CLINIC_OR_DEPARTMENT_OTHER): Payer: Self-pay | Admitting: *Deleted

## 2022-10-02 DIAGNOSIS — R52 Pain, unspecified: Secondary | ICD-10-CM | POA: Diagnosis not present

## 2022-10-02 DIAGNOSIS — J069 Acute upper respiratory infection, unspecified: Secondary | ICD-10-CM | POA: Diagnosis not present

## 2022-10-02 DIAGNOSIS — J029 Acute pharyngitis, unspecified: Secondary | ICD-10-CM | POA: Diagnosis not present

## 2022-10-04 ENCOUNTER — Other Ambulatory Visit: Payer: Self-pay | Admitting: Physician Assistant

## 2022-10-04 DIAGNOSIS — D709 Neutropenia, unspecified: Secondary | ICD-10-CM

## 2022-10-05 ENCOUNTER — Inpatient Hospital Stay: Payer: BC Managed Care – PPO | Admitting: Physician Assistant

## 2022-10-05 ENCOUNTER — Inpatient Hospital Stay: Payer: BC Managed Care – PPO

## 2022-11-08 ENCOUNTER — Telehealth: Payer: Self-pay

## 2022-11-08 ENCOUNTER — Inpatient Hospital Stay: Payer: BC Managed Care – PPO | Attending: Hematology and Oncology

## 2022-11-08 ENCOUNTER — Inpatient Hospital Stay (HOSPITAL_BASED_OUTPATIENT_CLINIC_OR_DEPARTMENT_OTHER): Payer: BC Managed Care – PPO | Admitting: Physician Assistant

## 2022-11-08 VITALS — BP 112/75 | HR 76 | Temp 98.1°F | Resp 16 | Wt 155.0 lb

## 2022-11-08 DIAGNOSIS — E785 Hyperlipidemia, unspecified: Secondary | ICD-10-CM | POA: Diagnosis not present

## 2022-11-08 DIAGNOSIS — D709 Neutropenia, unspecified: Secondary | ICD-10-CM

## 2022-11-08 DIAGNOSIS — Z0000 Encounter for general adult medical examination without abnormal findings: Secondary | ICD-10-CM | POA: Diagnosis not present

## 2022-11-08 DIAGNOSIS — D72819 Decreased white blood cell count, unspecified: Secondary | ICD-10-CM | POA: Diagnosis not present

## 2022-11-08 DIAGNOSIS — Z8042 Family history of malignant neoplasm of prostate: Secondary | ICD-10-CM | POA: Insufficient documentation

## 2022-11-08 DIAGNOSIS — Z23 Encounter for immunization: Secondary | ICD-10-CM | POA: Diagnosis not present

## 2022-11-08 DIAGNOSIS — Z1331 Encounter for screening for depression: Secondary | ICD-10-CM | POA: Diagnosis not present

## 2022-11-08 DIAGNOSIS — R5383 Other fatigue: Secondary | ICD-10-CM | POA: Diagnosis not present

## 2022-11-08 DIAGNOSIS — Z803 Family history of malignant neoplasm of breast: Secondary | ICD-10-CM | POA: Insufficient documentation

## 2022-11-08 DIAGNOSIS — Z1339 Encounter for screening examination for other mental health and behavioral disorders: Secondary | ICD-10-CM | POA: Diagnosis not present

## 2022-11-08 LAB — CBC WITH DIFFERENTIAL (CANCER CENTER ONLY)
Abs Immature Granulocytes: 0 10*3/uL (ref 0.00–0.07)
Basophils Absolute: 0 10*3/uL (ref 0.0–0.1)
Basophils Relative: 1 %
Eosinophils Absolute: 0 10*3/uL (ref 0.0–0.5)
Eosinophils Relative: 2 %
HCT: 39.3 % (ref 36.0–46.0)
Hemoglobin: 13.4 g/dL (ref 12.0–15.0)
Immature Granulocytes: 0 %
Lymphocytes Relative: 72 %
Lymphs Abs: 1.4 10*3/uL (ref 0.7–4.0)
MCH: 29.4 pg (ref 26.0–34.0)
MCHC: 34.1 g/dL (ref 30.0–36.0)
MCV: 86.2 fL (ref 80.0–100.0)
Monocytes Absolute: 0.3 10*3/uL (ref 0.1–1.0)
Monocytes Relative: 15 %
Neutro Abs: 0.2 10*3/uL — CL (ref 1.7–7.7)
Neutrophils Relative %: 10 %
Platelet Count: 262 10*3/uL (ref 150–400)
RBC: 4.56 MIL/uL (ref 3.87–5.11)
RDW: 12.2 % (ref 11.5–15.5)
Smear Review: NORMAL
WBC Count: 1.9 10*3/uL — ABNORMAL LOW (ref 4.0–10.5)
nRBC: 0 % (ref 0.0–0.2)

## 2022-11-08 LAB — CMP (CANCER CENTER ONLY)
ALT: 9 U/L (ref 0–44)
AST: 14 U/L — ABNORMAL LOW (ref 15–41)
Albumin: 4.4 g/dL (ref 3.5–5.0)
Alkaline Phosphatase: 72 U/L (ref 38–126)
Anion gap: 6 (ref 5–15)
BUN: 14 mg/dL (ref 6–20)
CO2: 27 mmol/L (ref 22–32)
Calcium: 9.5 mg/dL (ref 8.9–10.3)
Chloride: 108 mmol/L (ref 98–111)
Creatinine: 0.95 mg/dL (ref 0.44–1.00)
GFR, Estimated: >60 mL/min (ref >60)
Glucose, Bld: 90 mg/dL (ref 70–99)
Potassium: 4.1 mmol/L (ref 3.5–5.1)
Sodium: 141 mmol/L (ref 135–145)
Total Bilirubin: 0.5 mg/dL (ref 0.3–1.2)
Total Protein: 7 g/dL (ref 6.5–8.1)

## 2022-11-08 NOTE — Progress Notes (Signed)
[facility] [phone]   [phone]  PROGRESS NOTE  Patient Care Team: Alysia Penna, MD as PCP - General (Internal Medicine)  Hematological/Oncological History # Leukopenia 10/07/2019: WBC 3.6, Hgb 12.4, MCV 84.9, Plt 246. ANC 1.7 06/12/2020: WBC 2.3, Hgb 13.4, MCV 90.2, Plt 250, ANC 0.2 06/19/2020: WBC 2.14, Hgb 13.6, MCV 89.3, Plt 237, ANC 0.2 07/02/2020: establish care with Dr. [name]. WBC 2.0, Hgb 13.3, MCV 87.1, Plt 253, ANC 0.1 07/08/2020: WBC 2.4, Hgb 12.3, Plt 231, ANC 0.3 67/2022: WBC 3.1 with ANC 2.3 after prednisone 40mg  x 4 days 08/05/2020: WBC 2.0, ANC 0.1.  08/28/2020: WBC 4.1, Hgb 13.6, MCV 87.1, Plt 208. ANC 3.4 09/07/2020: WBC 3.5, Hgb 13.7, MCV 88.3, Plt 211. ANC 0.7 03/12/2021: WBC 3.4, Hgb 14.1, MCV 85.3, Plt 256, ANC 1.6  Interval History:  Debra Henry 27 y.o. female with medical history significant for severe neutropenia of unclear etiology presents for an urgent follow up visit.   On exam today Debra Henry is unaccompanied.  She reports having a respiratory infection last month with fevers, productive cough and congestions. She was treated with oral antibiotics but her cough lasted almost a month. She denies any subsequent infections since then. She has good energy levels but adds that she has noticed more frequent episodes of headaches/migraines.  She has been eating well and her appetite is strong.  She currently denies any fevers, chills, cough, or runny nose.  A full 10 point ROS is listed below.  MEDICAL HISTORY:  Past Medical History:  Diagnosis Date   Neutropenia (HCC)     SURGICAL HISTORY: No past surgical history on file.  SOCIAL HISTORY: Social History   Socioeconomic History   Marital status: Single    Spouse name: Not on file   Number of children: 0   Years of education: Not on file   Highest education level: Not on file  Occupational History   Not on file  Tobacco Use   Smoking status: Never    Smokeless tobacco: Never  Vaping Use   Vaping status: Never Used  Substance and Sexual Activity   Alcohol use: Never   Drug use: Not on file   Sexual activity: Not on file  Other Topics Concern   Not on file  Social History Narrative   ** Merged History Encounter **       Social Determinants of Health   Financial Resource Strain: Not on file  Food Insecurity: Not on file  Transportation Needs: Not on file  Physical Activity: Not on file  Stress: Not on file  Social Connections: Unknown (10/02/2022)   Received from [facility]   Social Network    Social Network: Not on file  Intimate Partner Violence: Unknown (10/02/2022)   Received from [facility]   HITS    Physically Hurt: Not on file    Insult or Talk Down To: Not on file    Threaten Physical Harm: Not on file    Scream or Curse: Not on file    FAMILY HISTORY: Family History  Problem Relation Age of Onset   Healthy Mother    Healthy Father    Prostate cancer Paternal Uncle    Breast cancer Paternal Grandmother    Sudden death Neg Hx    Hypertension Neg Hx    Hyperlipidemia Neg Hx    Heart attack Neg Hx    Diabetes Neg Hx     ALLERGIES:  has No Known Allergies.  MEDICATIONS:  Current Outpatient Medications  Medication Sig Dispense Refill   levonorgestrel (KYLEENA) 19.5 MG IUD Kyleena 17.5 mcg/24 hrs (5yrs) 19.5mg  intrauterine device  Take 1 device by intrauterine route.     dicyclomine (BENTYL) 20 MG tablet Take 1 tablet (20 mg total) by mouth 2 (two) times daily. (Patient not taking: Reported on 11/08/2022) 20 tablet 0   naproxen (NAPROSYN) 375 MG tablet Take 1 tablet (375 mg total) by mouth 2 (two) times daily with a meal. (Patient not taking: Reported on 11/08/2022) 10 tablet 0   ondansetron (ZOFRAN) 4 MG tablet Take 1 tablet (4 mg total) by mouth every 8 (eight) hours as needed for nausea or vomiting. (Patient not taking: Reported on 11/08/2022) 20 tablet 0   No current facility-administered  medications for this visit.    REVIEW OF SYSTEMS:   Constitutional: ( - ) fevers, ( - )  chills , ( - ) night sweats Eyes: ( - ) blurriness of vision, ( - ) double vision, ( - ) watery eyes Ears, nose, mouth, throat, and face: ( - ) mucositis, ( - ) sore throat Respiratory: ( - ) cough, ( - ) dyspnea, ( - ) wheezes Cardiovascular: ( - ) palpitation, ( - ) chest discomfort, ( - ) lower extremity swelling Gastrointestinal:  ( - ) nausea, ( - ) heartburn, ( - ) change in bowel habits Skin: ( - ) abnormal skin rashes Lymphatics: ( - ) new lymphadenopathy, ( - ) easy bruising Neurological: ( - ) numbness, ( - ) tingling, ( - ) new weaknesses Behavioral/Psych: ( - ) mood change, ( - ) new changes  All other systems were reviewed with the patient and are negative.  PHYSICAL EXAMINATION: ECOG PERFORMANCE STATUS: 0 - Asymptomatic  Vitals:   11/08/22 1106  BP: 112/75  Pulse: 76  Resp: 16  Temp: 98.1 F (36.7 C)  SpO2: 100%   Filed Weights   11/08/22 1106  Weight: 155 lb (70.3 kg)    GENERAL: Well-appearing young Caucasian female, alert, no distress and comfortable SKIN: skin color, texture, turgor are normal, no rashes or significant lesions EYES: conjunctiva are pink and non-injected, sclera clear LUNGS: clear to auscultation and percussion with normal breathing effort HEART: regular rate & rhythm and no murmurs and no lower extremity edema Musculoskeletal: no cyanosis of digits and no clubbing  PSYCH: alert & oriented x 3, fluent speech NEURO: no focal motor/sensory deficits  LABORATORY DATA:  I have reviewed the data as listed    Latest Ref Rng & Units 11/08/2022   10:48 AM 07/13/2022   10:16 AM 10/08/2021    3:14 PM  CBC  WBC 4.0 - 10.5 K/uL 1.9  1.6  2.6   Hemoglobin 12.0 - 15.0 g/dL 86.5  78.4  69.6   Hematocrit 36.0 - 46.0 % 39.3  40.1  36.6   Platelets 150 - 400 K/uL 262  275  239        Latest Ref Rng & Units 11/08/2022   10:48 AM 07/13/2022   10:16 AM 10/08/2021     3:14 PM  CMP  Glucose 70 - 99 mg/dL 90  95  98   BUN 6 - 20 mg/dL 14  12  14    Creatinine 0.44 - 1.00 mg/dL 2.95  2.84  1.32   Sodium 135 - 145 mmol/L 141  140  137   Potassium 3.5 - 5.1 mmol/L 4.1  4.1  3.9   Chloride 98 - 111 mmol/L 108  107  104   CO2 22 - 32 mmol/L 27  26  31    Calcium 8.9 - 10.3 mg/dL 9.5  9.6  9.2   Total Protein 6.5 - 8.1 g/dL 7.0  7.4  6.6   Total Bilirubin 0.3 - 1.2 mg/dL 0.5  0.6  0.4   Alkaline Phos 38 - 126 U/L 72  71  61   AST 15 - 41 U/L 14  19  15    ALT 0 - 44 U/L 9  13  10      RADIOGRAPHIC STUDIES: No results found.  ASSESSMENT & PLAN Debra Henry is a 27 y.o. female who presents for follow up of of neutropenia.    After review of the labs, review of the records, and discussion with the patient the patients findings are most consistent with a severe neutropenia of unclear etiology.  The patient has no focal symptoms which would lead [place] towards a possible cause.  Also the patient has had perfectly normal hemoglobin, platelet count, and MCV throughout this whole process.  She is virtually asymptomatic.  Our prior evaluation with nutritional and viral work-up were completely negative.  Bone marrow biopsy and review of peripheral blood film are also equally unrevealing.  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 This case has been discussed with Dr. Lowella Bandy at [facility].    # Leukopenia # Severe Neutropenia of Unclear Etiology -- negative viral work-up with HIV, hepatitis B, hepatitis C --negative nutritional work-up with vitamin B12, folate, homocystine, and methylmalonic acid. --Bone marrow biopsy shows no evidence of abnormalities. Peripheral blood film shows rare atypical lymphocyte and near absent neutrophils.  --steroid taper temporarily improved ANC. It has dropped back down after steroids were d/c --Patient has been seen by Dr. Lowella Bandy at [facility].   She agrees with our current management plan. --If we are unable to keep the patient's ANC elevated would recommend she monitor for bacterial infections and return to [place] if she would develop a temperature greater than 100.6 F.  We could administer G-CSF in attempt to increase her white blood cell count during infection. PLAN: --Labs today show persistent neutropenia with WBC 1.9, ANC 200. No other cytopenias --Patient had a respiratory infection last month that was treatment with antibiotics. No other infections noted.  --She is afebrile today without any infectious symptoms --Return to clinic in 12 months time with interval q 6 month labs.  No orders of the defined types were placed in this encounter.   All questions were answered. The patient knows to call the clinic with any problems, questions or concerns.  A total of more than 25 minutes were spent on this encounter with face-to-face time and non-face-to-face time, including preparing to see the patient, ordering tests and/or medications, counseling the patient and coordination of care as outlined above.   Georga Kaufmann PA-C Dept of Hematology and Oncology [facility] Phone: [phone]  11/08/2022 2:30 PM

## 2022-11-08 NOTE — Telephone Encounter (Signed)
CRITICAL VALUE STICKER  CRITICAL VALUE:  ANC 0.2  RECEIVER (on-site recipient of call): Daneil Dolin, LPN  DATE & TIME NOTIFIED: 11/08/22  11:42  MESSENGER (representative from lab): Pam  MD NOTIFIED:  Georga Kaufmann, PA   TIME OF NOTIFICATION:11:43  RESPONSE:

## 2022-11-14 ENCOUNTER — Encounter: Payer: Self-pay | Admitting: Internal Medicine

## 2022-11-14 ENCOUNTER — Other Ambulatory Visit (INDEPENDENT_AMBULATORY_CARE_PROVIDER_SITE_OTHER): Payer: BC Managed Care – PPO

## 2022-11-14 ENCOUNTER — Ambulatory Visit (INDEPENDENT_AMBULATORY_CARE_PROVIDER_SITE_OTHER): Payer: BC Managed Care – PPO | Admitting: Internal Medicine

## 2022-11-14 VITALS — BP 102/70 | HR 92 | Ht 65.0 in | Wt 155.0 lb

## 2022-11-14 DIAGNOSIS — R1031 Right lower quadrant pain: Secondary | ICD-10-CM | POA: Diagnosis not present

## 2022-11-14 DIAGNOSIS — R110 Nausea: Secondary | ICD-10-CM

## 2022-11-14 DIAGNOSIS — D2262 Melanocytic nevi of left upper limb, including shoulder: Secondary | ICD-10-CM | POA: Diagnosis not present

## 2022-11-14 DIAGNOSIS — R1032 Left lower quadrant pain: Secondary | ICD-10-CM

## 2022-11-14 DIAGNOSIS — D2261 Melanocytic nevi of right upper limb, including shoulder: Secondary | ICD-10-CM | POA: Diagnosis not present

## 2022-11-14 DIAGNOSIS — D709 Neutropenia, unspecified: Secondary | ICD-10-CM

## 2022-11-14 DIAGNOSIS — K219 Gastro-esophageal reflux disease without esophagitis: Secondary | ICD-10-CM

## 2022-11-14 DIAGNOSIS — R198 Other specified symptoms and signs involving the digestive system and abdomen: Secondary | ICD-10-CM | POA: Diagnosis not present

## 2022-11-14 DIAGNOSIS — L700 Acne vulgaris: Secondary | ICD-10-CM | POA: Diagnosis not present

## 2022-11-14 DIAGNOSIS — D225 Melanocytic nevi of trunk: Secondary | ICD-10-CM | POA: Diagnosis not present

## 2022-11-14 LAB — TSH: TSH: 1.16 u[IU]/mL (ref 0.35–5.50)

## 2022-11-14 MED ORDER — FAMOTIDINE 20 MG PO TABS: 20.0000 mg | ORAL_TABLET | Freq: Two times a day (BID) | ORAL | 2 refills | Status: AC | PRN

## 2022-11-14 MED ORDER — ONDANSETRON HCL 4 MG PO TABS: 4.0000 mg | ORAL_TABLET | Freq: Three times a day (TID) | ORAL | 0 refills | Status: AC | PRN

## 2022-11-14 NOTE — Progress Notes (Signed)
Chief Complaint: Diarrhea  HPI : 27 year old female with history of neutropenia of unclear etiology and migraines presents for diarrhea.  She has had irregular bowel habits for at least a year. About 10 minutes after eating, she will have a BM. She has one solid stool once a week when she feels like she fully evacuates, and on other days she has a small stool but doesn't empty fully. She has one BM every other day on average.  Her stools can be watery or loose.  Stools can sometimes be foul-smelling.  She has fecal urgency, particularly after eating certain foods such as [place] or [place] foods. Endorses ab pain before she has a BM. The pain is located in the lower abdomen and feels like a cramping sensation similar to a menstrual period.  Sometimes her abdominal pain improves after passing a BM. She has tried Miralax in the past, which did not help. She only tried the Miralax for about 2 days. She has a poor appetite. Endorses nausea but denies vomiting. Weight has been stable. Denies chest burning or regurgitation. In the mornings she will sometimes wake up with a sore throat. She used to use omeprazole but stopped. Denies blood in the stools. Endorses migraines, which have become slightly more frequent. Denies dizziness. Grandfather had colon polyps. Denies family history of GI issues. 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   Wt Readings from Last 3 Encounters:  11/14/22 155 lb (70.3 kg)  11/08/22 155 lb (70.3 kg)  10/08/21 152 lb 14.4 oz (69.4 kg)   Past Medical History:  Diagnosis Date   Neutropenia (HCC)    Past Surgical History:  Procedure Laterality Date   NO PAST SURGERIES     Family History  Problem Relation Age of Onset   Healthy Mother    Healthy Father    Breast cancer Maternal Grandmother    Cancer Maternal Grandfather        unsure type   Prostate cancer Paternal Uncle    Breast cancer Maternal  Aunt    Sudden death Neg Hx    Hypertension Neg Hx    Hyperlipidemia Neg Hx    Heart attack Neg Hx    Diabetes Neg Hx    Colon cancer Neg Hx    Esophageal cancer Neg Hx    Social History   Tobacco Use   Smoking status: Never   Smokeless tobacco: Never  Vaping Use   Vaping status: Never Used  Substance Use Topics   Alcohol use: Yes    Comment: social   Drug use: Never   Current Outpatient Medications  Medication Sig Dispense Refill   levonorgestrel (KYLEENA) 19.5 MG IUD Kyleena 17.5 mcg/24 hrs (69yrs) 19.5mg  intrauterine device  Take 1 device by intrauterine route.     No current facility-administered medications for this visit.   No Known Allergies  Review of Systems: All systems reviewed and negative except where noted in HPI.   Physical Exam: BP 102/70   Pulse 92   Ht 5\' 5"  (1.651 m)   Wt 155 lb (70.3 kg)   BMI 25.79 kg/m  Constitutional: Pleasant,well-developed, female in no acute distress. HEENT: Normocephalic and atraumatic. Conjunctivae are normal. No scleral icterus. Cardiovascular: Normal rate, regular rhythm.  Pulmonary/chest: Effort normal and breath sounds normal. No wheezing, rales or rhonchi. Abdominal: Soft, nondistended, nontender. Bowel sounds active throughout. There are no masses palpable. No  hepatomegaly. Extremities: No edema Neurological: Alert and oriented to person place and time. Skin: Skin is warm and dry. No rashes noted. Psychiatric: Normal mood and affect. Behavior is normal.  Labs 06/2020: CBC with low WBC of 2 and low ANC of 0.1. HIV NR. HCV antibody NR. Hep B core antibody NR. Hep B surface antibody reactive. LDH nml. Folate nml. Vit B12 nml.   Labs 10/2022: CMP unremarkable. CBC with low WBC of 1.9 and low ANC of 0.2  CT renal stone study 12/15/20: IMPRESSION: 1. No CT evidence for nephrolithiasis or obstructive uropathy. 2. No other acute intra-abdominal or pelvic process identified.  ASSESSMENT AND PLAN: Constipation,  occasional watery/loose stools Lower abdominal discomfort Fecal urgency Nausea, poor appetite GERD Neutropenia Patient presents with irregular bowel habits for the last year, characterized by sensation of incomplete evacuation, occasional loose/watery stools, fecal urgency, and foul-smelling stools.  The symptoms can sometimes be associated eating certain types of foods.  She does have lower abdominal discomfort that is associated with her bowel habits.  Endorses issues with nausea and GERD as well.  Etiology of her nausea could be multifactorial with GERD, constipation, or migraine as possible contributors to her symptoms.  Will plan for further evaluation with labs as well as infectious stool tests in the setting of her neutropenia.  Will have her do a trial of the low FODMAP diet and daily fiber supplement to see if this improves her GI symptoms.  Will also have her try a reflux medication to see if this helps with her nausea issues. - Low FODMAP diet - Start daily fiber supplement with Benefiber - Encourage hydration and physical activity  - Check CRP, TSH, TTG IgA, IgA, alpha gal IgE - [facility] GI pathogen panel with C dif  - Use Pepcid 20 mg BID PRN - Prescribe Zofran 4 mg TID PRN - RTC PRN  Eulah Pont, MD  I spent 45 minutes of time, including in depth chart review, independent review of results as outlined above, communicating results with the patient directly, face-to-face time with the patient, coordinating care, ordering studies and medications as appropriate, and documentation.

## 2022-11-14 NOTE — Patient Instructions (Addendum)
Your provider has requested that you go to the basement level for lab work before leaving today. Press "B" on the elevator. The lab is located at the first door on the left as you exit the elevator.  Your provider has ordered "[facility]" stool testing for you. You have received a kit from our office today containing all necessary supplies to complete this test. Please carefully read the stool collection instructions provided in the kit before opening the accompanying materials. In addition, be sure to place the label from the top right corner of the laboratory request sheet onto the "puritan opti-swab" tube that is supplied in the kit. This label should include your full name and date of birth. After completing the test, you should secure the purtian tube into the specimen biohazard bag. The laboratory request information sheet (including date and time of specimen collection) should be placed into the outside pocket of the specimen biohazard bag and returned to the [place] lab with 2 days of collection.   If the laboratory information sheet specimen date and time are not filled out, the test will NOT be performed.  Drink 8 cups of water a day and walk 30 minutes a day. Please purchase the following medications over the counter and take as directed: Fiber supplement such as Benefiber or Metamucil - use as directed daily  Follow up as needed    If your blood pressure at your visit was 140/90 or greater, please contact your primary care physician to follow up on this.  _______________________________________________________  If you are age 77 or older, your body mass index should be between 23-30. Your Body mass index is 25.79 kg/m. If this is out of the aforementioned range listed, please consider follow up with your Primary Care Provider.  If you are age 60 or younger, your body mass index should be between 19-25. Your Body mass index is 25.79 kg/m. If this is out of the aformentioned range  listed, please consider follow up with your Primary Care Provider.   ________________________________________________________  The [place] [facility] providers would like to encourage you to use [facility] to communicate with providers for non-urgent requests or questions.  Due to long hold times on the telephone, sending your provider a message by [facility] may be a faster and more efficient way to get a response.  Please allow 48 business hours for a response.  Please remember that this is for non-urgent requests.  _______________________________________________________   Thank you for entrusting me with your care and for choosing [facility], Dr. Eulah Pont

## 2022-11-15 DIAGNOSIS — R1031 Right lower quadrant pain: Secondary | ICD-10-CM | POA: Diagnosis not present

## 2022-11-15 DIAGNOSIS — D709 Neutropenia, unspecified: Secondary | ICD-10-CM | POA: Diagnosis not present

## 2022-11-15 DIAGNOSIS — Z130 Encounter for screening for diseases of the blood and blood-forming organs and certain disorders involving the immune mechanism: Secondary | ICD-10-CM | POA: Diagnosis not present

## 2022-11-15 DIAGNOSIS — Z01419 Encounter for gynecological examination (general) (routine) without abnormal findings: Secondary | ICD-10-CM | POA: Diagnosis not present

## 2022-11-15 DIAGNOSIS — R110 Nausea: Secondary | ICD-10-CM | POA: Diagnosis not present

## 2022-11-15 DIAGNOSIS — R198 Other specified symptoms and signs involving the digestive system and abdomen: Secondary | ICD-10-CM | POA: Diagnosis not present

## 2022-11-15 LAB — IGA: Immunoglobulin A: 99 mg/dL (ref 47–310)

## 2022-11-15 LAB — HIGH SENSITIVITY CRP: CRP, High Sensitivity: 1.09 mg/L (ref 0.000–5.000)

## 2022-11-15 LAB — TISSUE TRANSGLUTAMINASE, IGA: (tTG) Ab, IgA: 20.6 U/mL — ABNORMAL HIGH

## 2022-11-16 ENCOUNTER — Telehealth: Payer: Self-pay | Admitting: Internal Medicine

## 2022-11-16 NOTE — Telephone Encounter (Signed)
Spoke to the patient about her elevated TTG IgA.  This lab suggests that she could have celiac disease, which could explain some of her GI symptoms.  I recommended that we proceed with an upper endoscopy procedure to definitively determine if she has celiac disease.  Patient is agreeable to doing the upper endoscopy procedure.  Tisha, please call the patient and get her scheduled for an upper endoscopy procedure in the [facility].  Okay to use a 7:30 AM time slot if that works the best with the patient's schedule so that we can get her in sooner. Thank you.

## 2022-11-17 ENCOUNTER — Telehealth: Payer: Self-pay

## 2022-11-17 ENCOUNTER — Encounter: Payer: Self-pay | Admitting: Internal Medicine

## 2022-11-17 LAB — O215-IGE ALPHA-GAL: O215-IgE Alpha-Gal: <0.1 kU/L

## 2022-11-17 NOTE — Telephone Encounter (Signed)
Spoke to patient went over EGD instructions also sent it via MyChart patient is aware and verbalized understanding.

## 2022-11-17 NOTE — Progress Notes (Signed)
Received results of [facility] GI pathogen panel 11/15/2022, which was negative for all infection.  C. difficile was negative and Giardia was negative.  Will have these results scanned into the patient's chart.  Tisha, please call the patient to let her know that her stool test was negative for infection.  Thank you

## 2022-11-17 NOTE — Telephone Encounter (Signed)
Called patient scheduled EGD per provider request patient stated she has to check her work schedule she will call back to scheduled procedure.

## 2022-11-17 NOTE — Telephone Encounter (Signed)
Patient called and scheduled EGD for 11/12 at 3:00 PM. Please send instructions.

## 2022-11-17 NOTE — Telephone Encounter (Signed)
Spoke to patient advised per provider [facility] GI pathogen panel was negative for all infection. C.difficile was negative and Giardia was negative. Patient verbalized understanding.

## 2022-11-17 NOTE — Telephone Encounter (Signed)
Inbound call from patient returning call. Please advise. 

## 2022-11-29 ENCOUNTER — Ambulatory Visit (AMBULATORY_SURGERY_CENTER): Payer: BC Managed Care – PPO | Admitting: Internal Medicine

## 2022-11-29 ENCOUNTER — Encounter: Payer: Self-pay | Admitting: Internal Medicine

## 2022-11-29 VITALS — BP 101/72 | HR 79 | Temp 98.4°F | Resp 12 | Ht 65.0 in | Wt 155.0 lb

## 2022-11-29 DIAGNOSIS — D709 Neutropenia, unspecified: Secondary | ICD-10-CM

## 2022-11-29 DIAGNOSIS — K900 Celiac disease: Secondary | ICD-10-CM

## 2022-11-29 DIAGNOSIS — R768 Other specified abnormal immunological findings in serum: Secondary | ICD-10-CM

## 2022-11-29 MED ORDER — SODIUM CHLORIDE 0.9 % IV SOLN: 500.0000 mL | INTRAVENOUS | Status: AC

## 2022-11-29 NOTE — Progress Notes (Signed)
GASTROENTEROLOGY PROCEDURE H&P NOTE   Primary Care Physician: Alysia Penna, MD    Reason for Procedure:   Elevated TTG IgA, neutropenia  Plan:    EGD  Patient is appropriate for endoscopic procedure(s) in the ambulatory ([facility]) setting.  The nature of the procedure, as well as the risks, benefits, and alternatives were carefully and thoroughly reviewed with the patient. Ample time for discussion and questions allowed. The patient understood, was satisfied, and agreed to proceed.     HPI: Debra Henry is a 27 y.o. female who presents for EGD for evaluation of elevated TTG IgA and neutropenia .  Patient was most recently seen in the Gastroenterology Clinic on 11/14/22.  No interval change in medical history since that appointment. Please refer to that note for full details regarding GI history and clinical presentation.   Past Medical History:  Diagnosis Date   Neutropenia [facility])     Past Surgical History:  Procedure Laterality Date   BONE MARROW BIOPSY  2022   NO PAST SURGERIES      Prior to Admission medications   Medication Sig Start Date End Date Taking? Authorizing Provider  levonorgestrel (KYLEENA) 19.5 MG IUD Kyleena 17.5 mcg/24 hrs (25yrs) 19.5mg  intrauterine device  Take 1 device by intrauterine route.   Yes [provider]  famotidine (PEPCID) 20 MG tablet Take 1 tablet (20 mg total) by mouth 2 (two) times daily as needed for heartburn or indigestion. Could take scheduled for 1 week to see if it helps with nausea Patient not taking: Reported on 11/29/2022 11/14/22 12/14/22  Imogene Burn, MD  ondansetron (ZOFRAN) 4 MG tablet Take 1 tablet (4 mg total) by mouth 3 (three) times daily as needed for nausea or vomiting. Patient not taking: Reported on 11/29/2022 11/14/22 02/12/23  Imogene Burn, MD    Current Outpatient Medications  Medication Sig Dispense Refill   levonorgestrel (KYLEENA) 19.5 MG IUD Kyleena 17.5 mcg/24 hrs (67yrs) 19.5mg  intrauterine  device  Take 1 device by intrauterine route.     famotidine (PEPCID) 20 MG tablet Take 1 tablet (20 mg total) by mouth 2 (two) times daily as needed for heartburn or indigestion. Could take scheduled for 1 week to see if it helps with nausea (Patient not taking: Reported on 11/29/2022) 60 tablet 2   ondansetron (ZOFRAN) 4 MG tablet Take 1 tablet (4 mg total) by mouth 3 (three) times daily as needed for nausea or vomiting. (Patient not taking: Reported on 11/29/2022) 90 tablet 0   Current Facility-Administered Medications  Medication Dose Route Frequency Provider Last Rate Last Admin   0.9 %  sodium chloride infusion  500 mL Intravenous Continuous Imogene Burn, MD        Allergies as of 11/29/2022   (No Known Allergies)    Family History  Problem Relation Age of Onset   Healthy Mother    Healthy Father    Breast cancer Maternal Aunt    Colon cancer Maternal Uncle    Prostate cancer Paternal Uncle    Breast cancer Maternal Grandmother    Cancer Maternal Grandfather        unsure type   Sudden death Neg Hx    Hypertension Neg Hx    Hyperlipidemia Neg Hx    Heart attack Neg Hx    Diabetes Neg Hx    Esophageal cancer Neg Hx    Rectal cancer Neg Hx    Stomach cancer Neg Hx     Social History  Socioeconomic History   Marital status: Single    Spouse name: Not on file   Number of children: 0   Years of education: Not on file   Highest education level: Not on file  Occupational History   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[employer]  Tobacco Use   Smoking status: Never   Smokeless tobacco: Never  Vaping Use   Vaping status: Never Used  Substance and Sexual Activity   Alcohol use: Yes    Comment: social   Drug use: Never   Sexual activity: Not on file  Other Topics Concern   Not on file  Social History Narrative   ** Merged History Encounter **       Social Determinants of Health   Financial Resource Strain: Not on file  Food Insecurity: Not on file  Transportation Needs:  Not on file  Physical Activity: Not on file  Stress: Not on file  Social Connections: Unknown (10/02/2022)   Received from [facility]   Social Network    Social Network: Not on file  Intimate Partner Violence: Unknown (10/02/2022)   Received from [facility]   HITS    Physically Hurt: Not on file    Insult or Talk Down To: Not on file    Threaten Physical Harm: Not on file    Scream or Curse: Not on file    Physical Exam: Vital signs in last 24 hours: BP 121/72   Pulse 81   Temp 98.4 F (36.9 C)   Ht 5\' 5"  (1.651 m)   Wt 155 lb (70.3 kg)   SpO2 98%   BMI 25.79 kg/m  GEN: NAD EYE: Sclerae anicteric ENT: MMM CV: Non-tachycardic Pulm: No increased WOB GI: Soft NEURO:  Alert & Oriented   Eulah Pont, MD [facility]   11/29/2022 3:07 PM

## 2022-11-29 NOTE — Progress Notes (Signed)
VS by DT  Pt's states no medical or surgical changes since previsit or office visit.  

## 2022-11-29 NOTE — Patient Instructions (Addendum)
Discharge instructions given. Biopsies taken. Resume previous medications. Office visit scheduled. YOU HAD AN ENDOSCOPIC PROCEDURE TODAY AT [facility]:   Refer to the procedure report that was given to you for any specific questions about what was found during the examination.  If the procedure report does not answer your questions, please call your gastroenterologist to clarify.  If you requested that your care partner not be given the details of your procedure findings, then the procedure report has been included in a sealed envelope for you to review at your convenience later.  YOU SHOULD EXPECT: Some feelings of bloating in the abdomen. Passage of more gas than usual.  Walking can help get rid of the air that was put into your GI tract during the procedure and reduce the bloating. If you had a lower endoscopy (such as a colonoscopy or flexible sigmoidoscopy) you may notice spotting of blood in your stool or on the toilet paper. If you underwent a bowel prep for your procedure, you may not have a normal bowel movement for a few days.  Please Note:  You might notice some irritation and congestion in your nose or some drainage.  This is from the oxygen used during your procedure.  There is no need for concern and it should clear up in a day or so.  SYMPTOMS TO REPORT IMMEDIATELY:   Following upper endoscopy (EGD)  Vomiting of blood or coffee ground material  New chest pain or pain under the shoulder blades  Painful or persistently difficult swallowing  New shortness of breath  Fever of 100F or higher  Black, tarry-looking stools  For urgent or emergent issues, a gastroenterologist can be reached at any hour by calling [phone]. Do not use MyChart messaging for urgent concerns.    DIET:  We do recommend a small meal at first, but then you may proceed to your regular diet.  Drink plenty of fluids but you should avoid alcoholic beverages for 24 hours.  ACTIVITY:  You  should plan to take it easy for the rest of today and you should NOT DRIVE or use heavy machinery until tomorrow (because of the sedation medicines used during the test).    FOLLOW UP: Our staff will call the number listed on your records the next business day following your procedure.  We will call around 7:15- 8:00 am to check on you and address any questions or concerns that you may have regarding the information given to you following your procedure. If we do not reach you, we will leave a message.     If any biopsies were taken you will be contacted by phone or by letter within the next 1-3 weeks.  Please call us at [phone] if you have not heard about the biopsies in 3 weeks.    SIGNATURES/CONFIDENTIALITY: You and/or your care partner have signed paperwork which will be entered into your electronic medical record.  These signatures attest to the fact that that the information above on your After Visit Summary has been reviewed and is understood.  Full responsibility of the confidentiality of this discharge information lies with you and/or your care-partner.

## 2022-11-29 NOTE — Progress Notes (Signed)
Called to room to assist during endoscopic procedure.  Patient ID and intended procedure confirmed with present staff. Received instructions for my participation in the procedure from the performing physician.  

## 2022-11-29 NOTE — Op Note (Signed)
[facility] Patient Name: Debra Henry Procedure Date: 11/29/2022 3:11 PM MRN: [id number] Endoscopist: Madelyn Brunner [place] , , [phone] Age: 27 Referring MD:  Date of Birth: [birth year] Gender: Female Account #: [id number] Procedure:                Upper GI endoscopy Indications:              Positive celiac serologies (elevated TTG IgA) Medicines:                Monitored Anesthesia Care Procedure:                Pre-Anesthesia Assessment:                           - Prior to the procedure, a History and Physical                            was performed, and patient medications and                            allergies were reviewed. The patient's tolerance of                            previous anesthesia was also reviewed. The risks                            and benefits of the procedure and the sedation                            options and risks were discussed with the patient.                            All questions were answered, and informed consent                            was obtained. Prior Anticoagulants: The patient has                            taken no anticoagulant or antiplatelet agents. ASA                            Grade Assessment: II - A patient with mild systemic                            disease. After reviewing the risks and benefits,                            the patient was deemed in satisfactory condition to                            undergo the procedure.                           After obtaining informed consent, the endoscope was  passed under direct vision. Throughout the                            procedure, the patient's blood pressure, pulse, and                            oxygen saturations were monitored continuously. The                            GIF [id number] #[id number] was introduced through the                            mouth, and advanced to the second part of duodenum.                            The  upper GI endoscopy was accomplished without                            difficulty. The patient tolerated the procedure                            well. Scope In: Scope Out: Findings:                 The examined esophagus was normal. Patient has a                            proximal esophageal inlet patch.                           The entire examined stomach was normal.                           Localized mild mucosal variance characterized by                            congestion and altered texture was found in the                            duodenal bulb and in the second portion of the                            duodenum. Biopsies for histology were taken with a                            cold forceps for evaluation of celiac disease. Complications:            No immediate complications. Estimated Blood Loss:     Estimated blood loss was minimal. Impression:               - Normal esophagus. Proximal esophageal inlet patch.                           - Normal stomach.                           -  Mucosal variant in the duodenum. Biopsied. Recommendation:           - Discharge patient to home (with escort).                           - Await pathology results.                           - Please initiate a gluten free diet.                           - Return to GI clinic in 2-3 months.                           - The findings and recommendations were discussed                            with the patient. Dr Particia Lather "[name]" [name],  11/29/2022 3:37:37 PM

## 2022-11-29 NOTE — Progress Notes (Signed)
To pacu, VSS. Report to [email or web address] 

## 2022-11-30 ENCOUNTER — Telehealth: Payer: Self-pay

## 2022-11-30 DIAGNOSIS — R768 Other specified abnormal immunological findings in serum: Secondary | ICD-10-CM | POA: Diagnosis not present

## 2022-11-30 DIAGNOSIS — H5310 Unspecified subjective visual disturbances: Secondary | ICD-10-CM | POA: Diagnosis not present

## 2022-11-30 DIAGNOSIS — H5203 Hypermetropia, bilateral: Secondary | ICD-10-CM | POA: Diagnosis not present

## 2022-11-30 DIAGNOSIS — Z30433 Encounter for removal and reinsertion of intrauterine contraceptive device: Secondary | ICD-10-CM | POA: Diagnosis not present

## 2022-11-30 DIAGNOSIS — R519 Headache, unspecified: Secondary | ICD-10-CM | POA: Diagnosis not present

## 2022-11-30 NOTE — Telephone Encounter (Signed)
LMOM

## 2022-12-01 ENCOUNTER — Encounter: Payer: Self-pay | Admitting: Internal Medicine

## 2022-12-02 LAB — SURGICAL PATHOLOGY

## 2022-12-02 NOTE — Progress Notes (Signed)
Spoke to the patient about the results of her small bowel biopsies, which confirmed a diagnosis of celiac disease.  Patient has been diligent about trying to follow a gluten-free diet.  Patient states that her diarrhea has been better since she started a gluten-free diet.  I did tell the patient that she should try to avoid any food that has been cross contaminated with gluten.  She could look for foods with a gluten-free label to ensure that they are actually gluten-free.  Natural flavors on an ingredients list is most likely okay as part of a gluten-free diet.  I recommend that the patient check out the Celiac Disease Foundation website and National Celiac Association websites for additional information on celiac disease.  She has also been reading the handouts on celiac disease and gluten free diet that I provided to her after her endoscopy procedure. Will also provide her with information on a [place] support group for celiac disease patients.  I have follow-up scheduled with her in 02/2023

## 2023-01-12 DIAGNOSIS — Z30431 Encounter for routine checking of intrauterine contraceptive device: Secondary | ICD-10-CM | POA: Diagnosis not present

## 2023-01-12 DIAGNOSIS — R51 Headache with orthostatic component, not elsewhere classified: Secondary | ICD-10-CM | POA: Diagnosis not present

## 2023-02-15 ENCOUNTER — Encounter: Payer: Self-pay | Admitting: Internal Medicine

## 2023-02-16 ENCOUNTER — Other Ambulatory Visit: Payer: Self-pay

## 2023-02-16 DIAGNOSIS — E559 Vitamin D deficiency, unspecified: Secondary | ICD-10-CM

## 2023-02-16 DIAGNOSIS — E538 Deficiency of other specified B group vitamins: Secondary | ICD-10-CM

## 2023-02-16 DIAGNOSIS — K900 Celiac disease: Secondary | ICD-10-CM

## 2023-02-22 ENCOUNTER — Ambulatory Visit (INDEPENDENT_AMBULATORY_CARE_PROVIDER_SITE_OTHER): Payer: Self-pay | Admitting: Internal Medicine

## 2023-02-22 ENCOUNTER — Other Ambulatory Visit: Payer: Self-pay

## 2023-02-22 ENCOUNTER — Other Ambulatory Visit (INDEPENDENT_AMBULATORY_CARE_PROVIDER_SITE_OTHER): Payer: Managed Care, Other (non HMO)

## 2023-02-22 ENCOUNTER — Encounter: Payer: Self-pay | Admitting: Internal Medicine

## 2023-02-22 VITALS — BP 114/70 | HR 78 | Ht 67.0 in | Wt 160.2 lb

## 2023-02-22 DIAGNOSIS — D709 Neutropenia, unspecified: Secondary | ICD-10-CM | POA: Diagnosis not present

## 2023-02-22 DIAGNOSIS — K900 Celiac disease: Secondary | ICD-10-CM

## 2023-02-22 DIAGNOSIS — E559 Vitamin D deficiency, unspecified: Secondary | ICD-10-CM

## 2023-02-22 DIAGNOSIS — E538 Deficiency of other specified B group vitamins: Secondary | ICD-10-CM

## 2023-02-22 DIAGNOSIS — R110 Nausea: Secondary | ICD-10-CM

## 2023-02-22 DIAGNOSIS — K8681 Exocrine pancreatic insufficiency: Secondary | ICD-10-CM | POA: Diagnosis not present

## 2023-02-22 DIAGNOSIS — R1031 Right lower quadrant pain: Secondary | ICD-10-CM

## 2023-02-22 DIAGNOSIS — K219 Gastro-esophageal reflux disease without esophagitis: Secondary | ICD-10-CM

## 2023-02-22 DIAGNOSIS — R198 Other specified symptoms and signs involving the digestive system and abdomen: Secondary | ICD-10-CM

## 2023-02-22 LAB — CBC WITH DIFFERENTIAL/PLATELET
Basophils Absolute: 0 10*3/uL (ref 0.0–0.1)
Basophils Relative: 0.4 % (ref 0.0–3.0)
Eosinophils Absolute: 0.1 10*3/uL (ref 0.0–0.7)
Eosinophils Relative: 2.2 % (ref 0.0–5.0)
HCT: 40 % (ref 36.0–46.0)
Hemoglobin: 13.8 g/dL (ref 12.0–15.0)
Lymphocytes Relative: 66.9 % — ABNORMAL HIGH (ref 12.0–46.0)
Lymphs Abs: 1.5 10*3/uL (ref 0.7–4.0)
MCHC: 34.5 g/dL (ref 30.0–36.0)
MCV: 87.2 fL (ref 78.0–100.0)
Monocytes Absolute: 0.5 10*3/uL (ref 0.1–1.0)
Monocytes Relative: 20.2 % — ABNORMAL HIGH (ref 3.0–12.0)
Neutro Abs: 0.2 10*3/uL — ABNORMAL LOW (ref 1.4–7.7)
Neutrophils Relative %: 10.3 % — ABNORMAL LOW (ref 43.0–77.0)
Platelets: 299 10*3/uL (ref 150.0–400.0)
RBC: 4.59 Mil/uL (ref 3.87–5.11)
RDW: 12.9 % (ref 11.5–15.5)
WBC: 2.3 10*3/uL — ABNORMAL LOW (ref 4.0–10.5)

## 2023-02-22 LAB — IBC + FERRITIN
Ferritin: 51.5 ng/mL (ref 10.0–291.0)
Iron: 69 ug/dL (ref 42–145)
Saturation Ratios: 22.7 % (ref 20.0–50.0)
TIBC: 303.8 ug/dL (ref 250.0–450.0)
Transferrin: 217 mg/dL (ref 212.0–360.0)

## 2023-02-22 LAB — B12 AND FOLATE PANEL
Folate: 5.9 ng/mL — ABNORMAL LOW (ref >5.9)
Vitamin B-12: 293 pg/mL (ref 211–911)

## 2023-02-22 LAB — VITAMIN D 25 HYDROXY (VIT D DEFICIENCY, FRACTURES): VITD: 18.62 ng/mL — ABNORMAL LOW (ref 30.00–100.00)

## 2023-02-22 NOTE — Patient Instructions (Addendum)
 Your provider has requested that you go to the basement level for lab work before leaving today. Press B on the elevator. The lab is located at the first door on the left as you exit the elevator.  Follow up in 6 months _______________________________________________________  If your blood pressure at your visit was 140/90 or greater, please contact your primary care physician to follow up on this.  _______________________________________________________  If you are age 77 or older, your body mass index should be between 23-30. Your Body mass index is 25.1 kg/m. If this is out of the aforementioned range listed, please consider follow up with your Primary Care Provider.  If you are age 82 or younger, your body mass index should be between 19-25. Your Body mass index is 25.1 kg/m. If this is out of the aformentioned range listed, please consider follow up with your Primary Care Provider.   ________________________________________________________  The [facility] providers would like to encourage you to use MYCHART to communicate with providers for non-urgent requests or questions.  Due to long hold times on the telephone, sending your provider a message by [facility] may be a faster and more efficient way to get a response.  Please allow 48 business hours for a response.  Please remember that this is for non-urgent requests.  _______________________________________________________  Due to recent changes in healthcare laws, you may see the results of your imaging and laboratory studies on MyChart before your provider has had a chance to review them.  We understand that in some cases there may be results that are confusing or concerning to you. Not all laboratory results come back in the same time frame and the provider may be waiting for multiple results in order to interpret others.  Please give us  48 hours in order for your provider to thoroughly review all the results before contacting the office  for clarification of your results.   Thank you for entrusting me with your care and for choosing [facility], Dr. Estefana Kidney

## 2023-02-22 NOTE — Progress Notes (Signed)
 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   Interval History:   She has been following a gluten free diet. Her stomach felt bad maybe a month ago when she was eating out and she believes that she accidentally ingested gluten at that time. Overall she feels better because she feels she has to spend less time in the bathroom. Stools are not as foul smelling. She feels like her vision has improved as well. Denies mouth sores or migraines. Denies rashes. She feels less tired. She has some abdominal discomfort with period cramps, but this is relatively normal for her.  Wt Readings from Last 3 Encounters:  02/22/23 160 lb 4 oz (72.7 kg)  11/29/22 155 lb (70.3 kg)  11/14/22 155 lb (70.3 kg)   Past Medical History:  Diagnosis Date   Celiac disease    Neutropenia (HCC)    Past Surgical History:  Procedure Laterality Date   BONE MARROW BIOPSY  2022   NO PAST SURGERIES     Family History  Problem Relation Age of Onset   Healthy Mother    Healthy Father    Breast cancer Maternal Aunt    Colon cancer Maternal Uncle    Prostate cancer Paternal Uncle    Breast cancer Maternal Grandmother    Cancer Maternal Grandfather        unsure type   Sudden death Neg Hx    Hypertension Neg Hx    Hyperlipidemia Neg Hx    Heart attack Neg Hx    Diabetes Neg Hx    Esophageal cancer Neg Hx    Rectal cancer Neg Hx    Stomach cancer Neg Hx    Social History   Tobacco Use   Smoking status: Never   Smokeless tobacco: Never  Vaping Use   Vaping status: Never Used  Substance Use Topics   Alcohol  use: Yes    Comment: social   Drug use: Never   Current Outpatient Medications  Medication Sig Dispense Refill   famotidine  (PEPCID ) 20 MG tablet Take 1 tablet (20 mg  total) by mouth 2 (two) times daily as needed for heartburn or indigestion. Could take scheduled for 1 week to see if it helps with nausea (Patient not taking: Reported on 02/22/2023) 60 tablet 2   levonorgestrel  (KYLEENA ) 19.5 MG IUD Kyleena  17.5 mcg/24 hrs (72yrs) 19.5mg  intrauterine device  Take 1 device by intrauterine route.     No current facility-administered medications for this visit.   Allergies  Allergen Reactions   Gluten Meal Other (See Comments)    GI upset.    Physical Exam: BP 114/70   Pulse 78   Ht 5' 7 (1.702 m)   Wt 160 lb 4 oz (72.7 kg)   BMI 25.10 kg/m  Constitutional: Pleasant,well-developed, female in no acute distress. HEENT: Normocephalic and atraumatic. Conjunctivae are normal. No scleral icterus. Cardiovascular: Normal rate, regular rhythm.  Pulmonary/chest: Effort normal and breath sounds normal. No wheezing, rales or rhonchi. Abdominal: Soft, nondistended, nontender. Bowel sounds active throughout. There are no masses palpable. No hepatomegaly. Extremities: No edema Neurological: Alert and oriented to person place and time. Skin: Skin is warm and dry. No rashes noted. Psychiatric: Normal mood and affect. Behavior is normal.  Labs 06/2020: CBC with low  WBC of 2 and low ANC of 0.1. HIV NR. HCV antibody NR. Hep B core antibody NR. Hep B surface antibody reactive. LDH nml. Folate nml. Vit B12 nml.   Labs 10/2022: CMP unremarkable. CBC with low WBC of 1.9 and low ANC of 0.2. Alpha gal negative. TTG IgA elevated at 20.6. IgA nml. TSH nml. CRP nml.   CT renal stone study 12/15/20: IMPRESSION: 1. No CT evidence for nephrolithiasis or obstructive uropathy. 2. No other acute intra-abdominal or pelvic process identified.  EGD 11/29/22: - Normal esophagus. Proximal esophageal inlet patch. - Normal stomach. - Mucosal variant in the duodenum. Biopsied. Path: 1. Surgical [P], duodenal bxs :       - BENIGN SMALL BOWEL MUCOSA WITH MILD VILLOUS BLUNTING AND   INCREASED       INTRAEPITHELIAL LYMPHOCYTES.  SEE NOTE.       Diagnosis Note : - The patient's history of elevated transglutaminase levels is       noted.  In that context the findings are compatible with Celiac disease   ASSESSMENT AND PLAN: Celiac disease Neutropenia (presumed autoimmune) Patient overall has had some improvement in her GI symptoms on a gluten-free diet.  However she is not sure if she is optimally following a gluten-free diet.  Since when she goes out to eat, she is not sure about restaurants that are not able to promise that foods are cooked in a gluten-free environment even if the foods themselves are gluten free.  I told the patient to try her best with sticking to a gluten free diet and that we will intermittently plan to check her labs to see if she is making progress with her celiac disease. Will plan to check her CBC and vitamin levels to see if they have improved at all on a gluten free diet.  Will also check a TTG IgA to see if the patient is making progress in decreasing her celiac antibody levels. - Check CBC, vitamin D , folate, vitamin B12, ferritin/IBC, TTG IgA - Continue gluten free diet - RTC 6 months  Estefana Kidney, MD  I spent 36 minutes of time, including in depth chart review, independent review of results as outlined above, communicating results with the patient directly, face-to-face time with the patient, coordinating care, ordering studies and medications as appropriate, and documentation.

## 2023-02-23 ENCOUNTER — Encounter: Payer: Self-pay | Admitting: Internal Medicine

## 2023-02-24 ENCOUNTER — Encounter: Payer: Self-pay | Admitting: Internal Medicine

## 2023-02-24 LAB — IGA: Immunoglobulin A: 95 mg/dL (ref 47–310)

## 2023-02-24 LAB — TISSUE TRANSGLUTAMINASE, IGA: (tTG) Ab, IgA: 3.7 U/mL

## 2023-02-25 IMAGING — CT CT RENAL STONE PROTOCOL
3 of 4 series · 6 of 46 positions shown, 11 images · non-contrast
Comparison: None.

CLINICAL DATA: Initial evaluation for acute flank pain, stone
disease suspected.

EXAM:
CT ABDOMEN AND PELVIS WITHOUT CONTRAST
TECHNIQUE: Multidetector CT imaging of the abdomen and pelvis was performed
following the standard protocol without IV contrast.

[Series 4: lungs · axial · 0.68mm/px · z∈[+1114,+1134]mm · 2 of 14 slices shown, 5 images]
[im 5/14  soft-tissue]
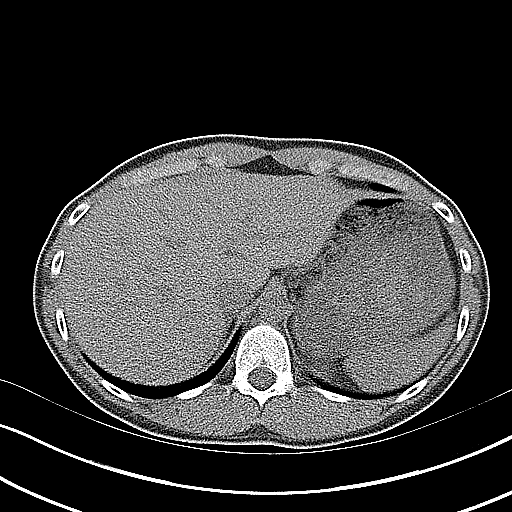
[im 5/14  lung]
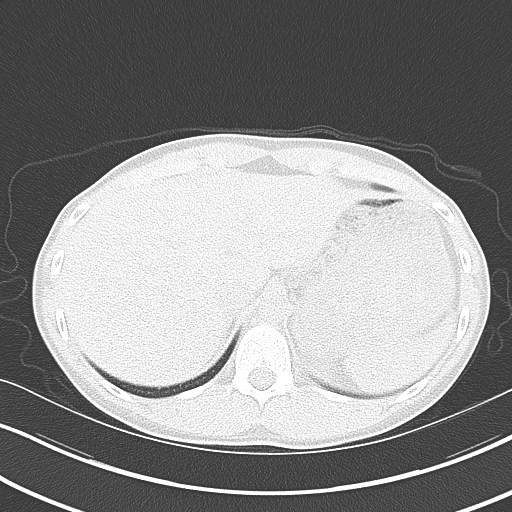
[im 5/14  bone]
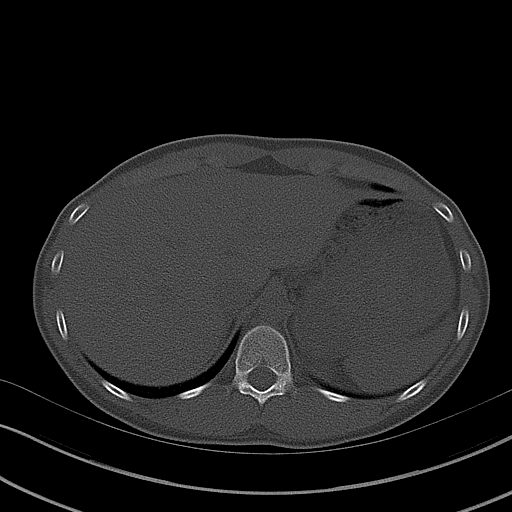
[im 9/14  soft-tissue]
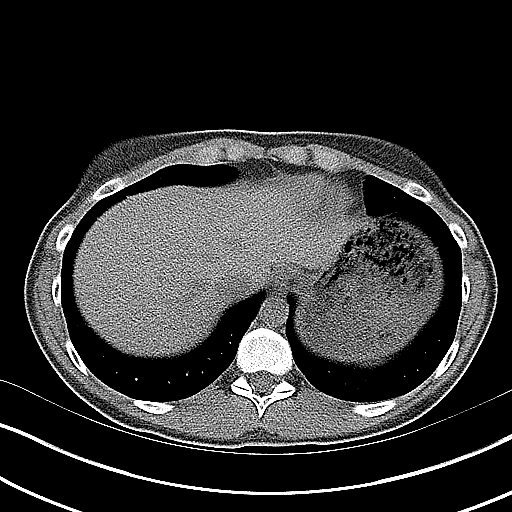
[im 9/14  lung]
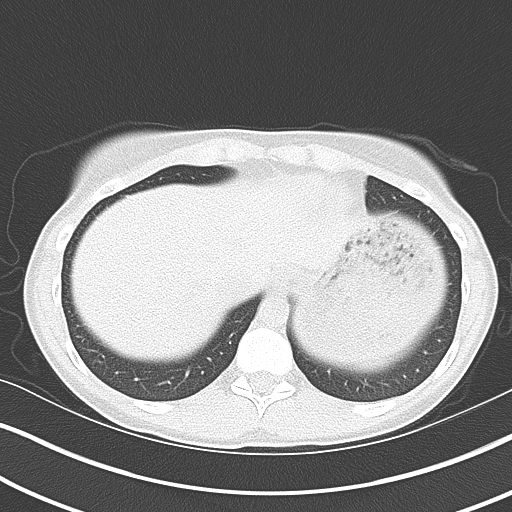

[Series 5: coronal · coronal · 0.79mm/px · 3 of 77 slices shown, 4 images]
[im 26/77  soft-tissue]
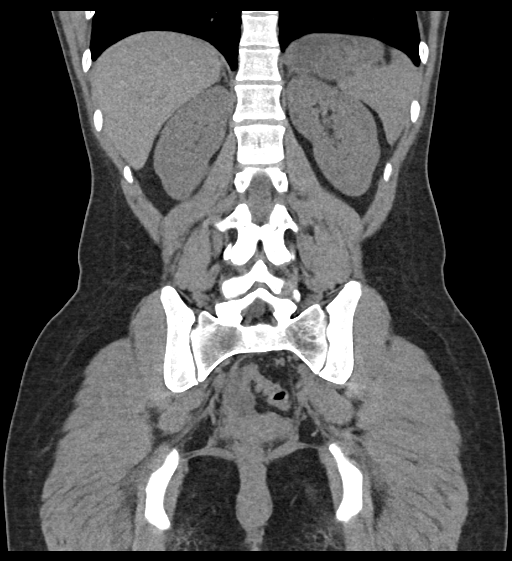
[im 34/77  soft-tissue]
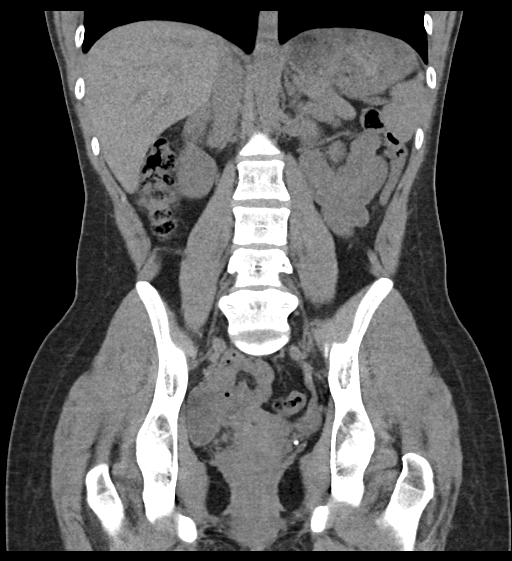
[im 34/77  bone]
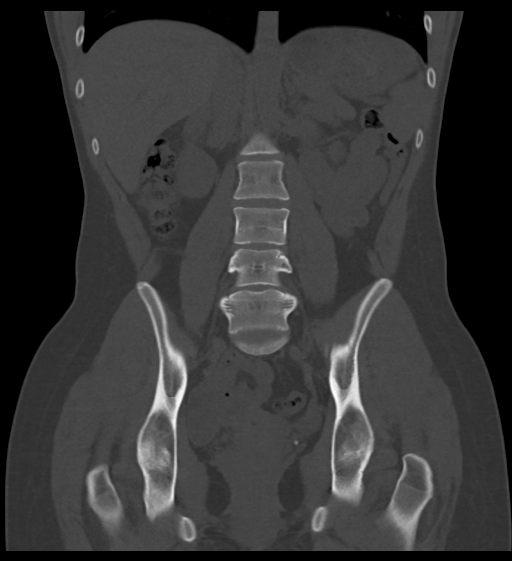
[im 43/77  soft-tissue]
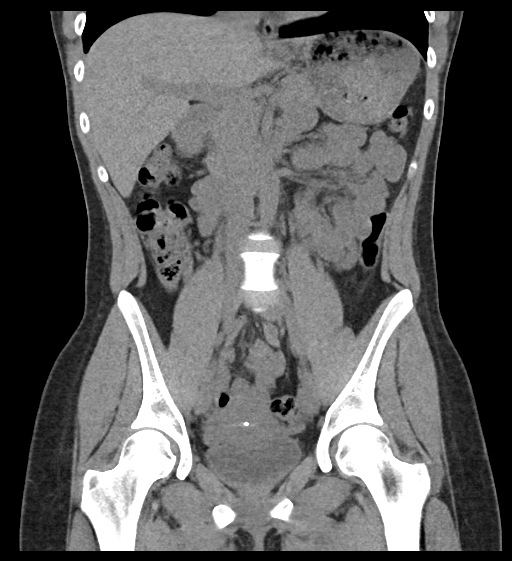

[Series 6: sagittal · sagittal · 0.45mm/px · 1 of 123 slices shown, 2 images]
[im 41/123  soft-tissue]
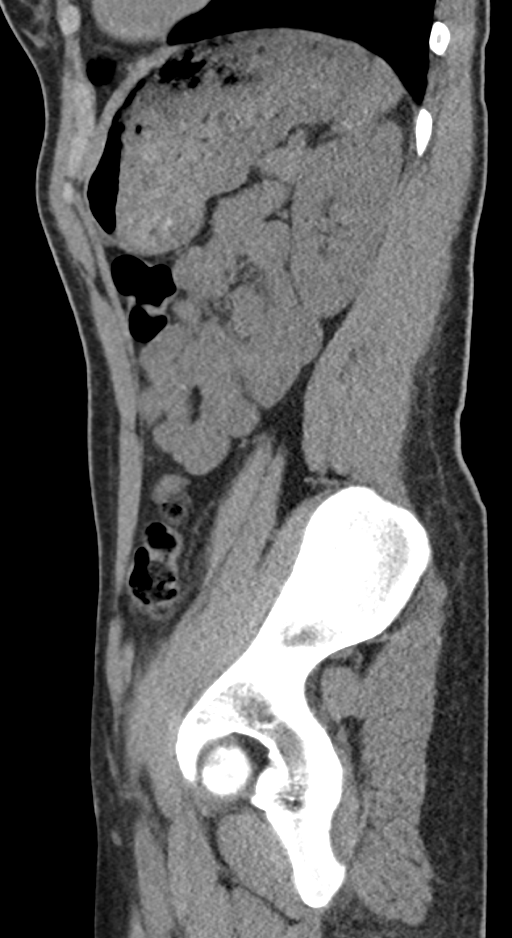
[im 41/123  bone]
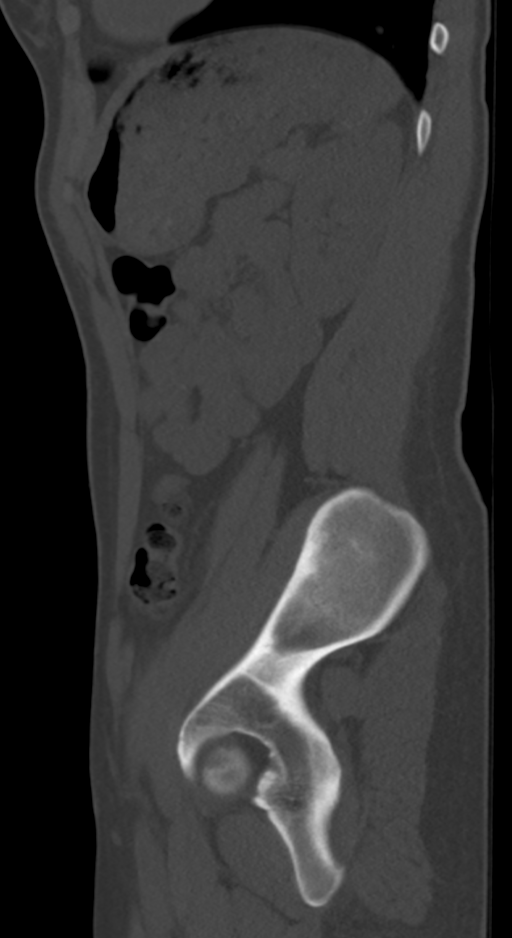

[6 of 46 positions shown; findings below may reference images not displayed]

FINDINGS: Lower chest: Visualized lung bases are clear.

Hepatobiliary: Limited noncontrast evaluation of the liver is
unremarkable. Gallbladder contracted without acute finding. No early
dilatation.

Pancreas: Unremarkable. No pancreatic ductal dilatation or
surrounding inflammatory changes.

Spleen: Choose 1

Adrenals/Urinary Tract: Adrenal glands within normal limits. Kidneys
equal in size without nephrolithiasis or hydronephrosis. No stone
seen along the course of either renal collecting system. No
hydroureter. No focal renal mass on this noncontrast examination.
Partially distended bladder within normal limits. No layering stones
within the bladder lumen.

Stomach/Bowel: Stomach within normal limits. No evidence for bowel
obstruction. Normal appendix. No acute inflammatory changes seen
about the bowels.

Vascular/Lymphatic: Intra-abdominal aorta of normal caliber. No
adenopathy.

Reproductive: IUD in place within the uterus. Uterus and ovaries
within normal limits for age.

Other: Small volume free fluid within the pelvic cul-de-sac, likely
physiologic. No free air. Small fat containing paraumbilical hernia
without inflammation.

Musculoskeletal: No acute osseous finding. No discrete or worrisome
osseous lesions.
IMPRESSION: 1. No CT evidence for nephrolithiasis or obstructive uropathy.
2. No other acute intra-abdominal or pelvic process identified.

## 2023-05-10 ENCOUNTER — Telehealth: Payer: Self-pay | Admitting: *Deleted

## 2023-05-10 ENCOUNTER — Other Ambulatory Visit: Payer: Self-pay | Admitting: *Deleted

## 2023-05-10 ENCOUNTER — Inpatient Hospital Stay: Payer: BC Managed Care – PPO

## 2023-05-10 ENCOUNTER — Telehealth: Payer: Self-pay | Admitting: Hematology and Oncology

## 2023-05-10 DIAGNOSIS — D709 Neutropenia, unspecified: Secondary | ICD-10-CM

## 2023-05-10 NOTE — Telephone Encounter (Signed)
 Lab request/order for CBC. CMP fax'd to [facility] in [place]  as pt is currently residing there and needs labs done.

## 2023-05-22 ENCOUNTER — Telehealth: Payer: Self-pay | Admitting: Internal Medicine

## 2023-05-22 NOTE — Telephone Encounter (Signed)
 Spoke with Pt. Pt stated that she needs a form signed today for a scholarship that she is applying for. Pt was notified that Dr. Rosaline Coma is out of the office this week, Pt was notified that if she can bring the form by the office that we could try and get another provider to sign it.  Pt stated that she would get her mom to drop it off this afternoon.

## 2023-05-22 NOTE — Telephone Encounter (Signed)
 Form was signed. Given back to mother

## 2023-05-22 NOTE — Telephone Encounter (Signed)
 Inbound call from patient requesting to speak with nurse. States she is apply for a scholarship that is due tonight and needs paperwork signed by Dr. Rosaline Coma stating she was diagnosed  with Celiac. Requesting a call back. Please advise, thank you.

## 2023-11-07 ENCOUNTER — Other Ambulatory Visit: Payer: Self-pay | Admitting: Hematology and Oncology

## 2023-11-07 DIAGNOSIS — D709 Neutropenia, unspecified: Secondary | ICD-10-CM

## 2023-11-07 NOTE — Progress Notes (Deleted)
 [facility] [phone]   [phone]  PROGRESS NOTE  Patient Care Team: Larnell Hamilton, MD as PCP - General (Internal Medicine)  Hematological/Oncological History # Leukopenia 10/07/2019: WBC 3.6, Hgb 12.4, MCV 84.9, Plt 246. ANC 1.7 06/12/2020: WBC 2.3, Hgb 13.4, MCV 90.2, Plt 250, ANC 0.2 06/19/2020: WBC 2.14, Hgb 13.6, MCV 89.3, Plt 237, ANC 0.2 07/02/2020: establish care with Dr. Federico. WBC 2.0, Hgb 13.3, MCV 87.1, Plt 253, ANC 0.1 07/08/2020: WBC 2.4, Hgb 12.3, Plt 231, ANC 0.3 67/2022: WBC 3.1 with ANC 2.3 after prednisone  40mg  x 4 days 08/05/2020: WBC 2.0, ANC 0.1.  08/28/2020: WBC 4.1, Hgb 13.6, MCV 87.1, Plt 208. ANC 3.4 09/07/2020: WBC 3.5, Hgb 13.7, MCV 88.3, Plt 211. ANC 0.7 03/12/2021: WBC 3.4, Hgb 14.1, MCV 85.3, Plt 256, ANC 1.6  Interval History:  Debra Henry 28 y.o. female with medical history significant for severe neutropenia of unclear etiology presents for an urgent follow up visit. The patient's last visit was on 11/08/2022. In the interim since the last visit she ***  On exam today Debra Henry is unaccompanied.  She reports ***  MEDICAL HISTORY:  Past Medical History:  Diagnosis Date   Celiac disease    Neutropenia     SURGICAL HISTORY: Past Surgical History:  Procedure Laterality Date   BONE MARROW BIOPSY  2022   NO PAST SURGERIES      SOCIAL HISTORY: Social History   Socioeconomic History   Marital status: Single    Spouse name: Not on file   Number of children: 0   Years of education: Not on file   Highest education level: Not on file  Occupational History   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  Tobacco Use   Smoking status: Never   Smokeless tobacco: Never  Vaping Use   Vaping status: Never Used  Substance and Sexual Activity   Alcohol  use: Yes    Comment: social   Drug use: Never   Sexual activity: Not on file  Other Topics Concern   Not on file  Social History Narrative   ** Merged History  Encounter **       Social Drivers of Health   Financial Resource Strain: Not on file  Food Insecurity: Not on file  Transportation Needs: Not on file  Physical Activity: Not on file  Stress: Not on file  Social Connections: Unknown (10/02/2022)   Received from [facility]   Social Network    Social Network: Not on file  Intimate Partner Violence: Unknown (10/02/2022)   Received from [facility]   HITS    Physically Hurt: Not on file    Insult or Talk Down To: Not on file    Threaten Physical Harm: Not on file    Scream or Curse: Not on file    FAMILY HISTORY: Family History  Problem Relation Age of Onset   Healthy Mother    Healthy Father    Breast cancer Maternal Aunt    Colon cancer Maternal Uncle    Prostate cancer Paternal Uncle    Breast cancer Maternal Grandmother    Cancer Maternal Grandfather        unsure type   Sudden death Neg Hx    Hypertension Neg Hx    Hyperlipidemia Neg Hx    Heart attack Neg Hx    Diabetes Neg Hx    Esophageal cancer Neg Hx    Rectal cancer Neg Hx    Stomach cancer Neg Hx  ALLERGIES:  is allergic to gluten meal.  MEDICATIONS:  Current Outpatient Medications  Medication Sig Dispense Refill   famotidine  (PEPCID ) 20 MG tablet Take 1 tablet (20 mg total) by mouth 2 (two) times daily as needed for heartburn or indigestion. Could take scheduled for 1 week to see if it helps with nausea (Patient not taking: Reported on 02/22/2023) 60 tablet 2   levonorgestrel  (KYLEENA ) 19.5 MG IUD Kyleena  17.5 mcg/24 hrs (35yrs) 19.5mg  intrauterine device  Take 1 device by intrauterine route.     No current facility-administered medications for this visit.    REVIEW OF SYSTEMS:   Constitutional: ( - ) fevers, ( - )  chills , ( - ) night sweats Eyes: ( - ) blurriness of vision, ( - ) double vision, ( - ) watery eyes Ears, nose, mouth, throat, and face: ( - ) mucositis, ( - ) sore throat Respiratory: ( - ) cough, ( - ) dyspnea, ( - )  wheezes Cardiovascular: ( - ) palpitation, ( - ) chest discomfort, ( - ) lower extremity swelling Gastrointestinal:  ( - ) nausea, ( - ) heartburn, ( - ) change in bowel habits Skin: ( - ) abnormal skin rashes Lymphatics: ( - ) new lymphadenopathy, ( - ) easy bruising Neurological: ( - ) numbness, ( - ) tingling, ( - ) new weaknesses Behavioral/Psych: ( - ) mood change, ( - ) new changes  All other systems were reviewed with the patient and are negative.  PHYSICAL EXAMINATION: ECOG PERFORMANCE STATUS: 0 - Asymptomatic  There were no vitals filed for this visit.  There were no vitals filed for this visit.   GENERAL: Well-appearing young Caucasian female, alert, no distress and comfortable SKIN: skin color, texture, turgor are normal, no rashes or significant lesions EYES: conjunctiva are pink and non-injected, sclera clear LUNGS: clear to auscultation and percussion with normal breathing effort HEART: regular rate & rhythm and no murmurs and no lower extremity edema Musculoskeletal: no cyanosis of digits and no clubbing  PSYCH: alert & oriented x 3, fluent speech NEURO: no focal motor/sensory deficits  LABORATORY DATA:  I have reviewed the data as listed    Latest Ref Rng & Units 02/22/2023    7:45 AM 11/08/2022   10:48 AM 07/13/2022   10:16 AM  CBC  WBC 4.0 - 10.5 K/uL 2.3 Repeated and verified X2.  1.9  1.6   Hemoglobin 12.0 - 15.0 g/dL 86.1  86.5  85.6   Hematocrit 36.0 - 46.0 % 40.0  39.3  40.1   Platelets 150.0 - 400.0 K/uL 299.0  262  275        Latest Ref Rng & Units 11/08/2022   10:48 AM 07/13/2022   10:16 AM 10/08/2021    3:14 PM  CMP  Glucose 70 - 99 mg/dL 90  95  98   BUN 6 - 20 mg/dL 14  12  14    Creatinine 0.44 - 1.00 mg/dL 9.04  9.19  9.10   Sodium 135 - 145 mmol/L 141  140  137   Potassium 3.5 - 5.1 mmol/L 4.1  4.1  3.9   Chloride 98 - 111 mmol/L 108  107  104   CO2 22 - 32 mmol/L 27  26  31    Calcium 8.9 - 10.3 mg/dL 9.5  9.6  9.2   Total Protein 6.5 -  8.1 g/dL 7.0  7.4  6.6   Total Bilirubin 0.3 - 1.2 mg/dL 0.5  0.6  0.4  Alkaline Phos 38 - 126 U/L 72  71  61   AST 15 - 41 U/L 14  19  15    ALT 0 - 44 U/L 9  13  10      RADIOGRAPHIC STUDIES: No results found.  ASSESSMENT & PLAN Debra Henry 28 y.o. female with no significant past medical history who presents for follow up of of neutropenia.    After review of the labs, review of the records, and discussion with the patient the patients findings are most consistent with a severe neutropenia of unclear etiology.  The patient has no focal symptoms which would lead us  towards a possible cause.  Also the patient has had perfectly normal hemoglobin, platelet count, and MCV throughout this whole process.  She is virtually asymptomatic.  Our prior evaluation with nutritional and viral work-up were completely negative.  Bone marrow biopsy and review of peripheral blood film are also equally unrevealing.  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  We will attempt a steroid pulse to see if this will help to boost the patient's white blood cell count with a durable response.  This case has been discussed with Dr. Reyna at [facility].    # Leukopenia # Severe Neutropenia of Unclear Etiology -- negative viral work-up with HIV, hepatitis B, hepatitis C --negative nutritional work-up with vitamin B12, folate, homocystine, and methylmalonic acid. -CBC today shows an ANC of 0.6, white blood cell 2.6, hemoglobin 12.7, MCV 85.5, and platelets of 239 --Bone marrow biopsy shows no evidence of abnormalities. Peripheral blood film shows rare atypical lymphocyte and near absent neutrophils.  --steroid taper temporarily improved ANC. It has dropped back down after steroids were d/c --Patient has been seen by Dr. Reyna at [facility].  She agrees with our current management plan. --If we are unable to keep the patient's ANC  elevated would recommend she monitor for bacterial infections and return to us  if she would develop a temperature greater than 100.6 F.  We could administer G-CSF in attempt to increase her white blood cell count during infection. --Return to clinic in 12 months time with interval q 6 month labs.  No orders of the defined types were placed in this encounter.   All questions were answered. The patient knows to call the clinic with any problems, questions or concerns.  A total of more than 25 minutes were spent on this encounter with face-to-face time and non-face-to-face time, including preparing to see the patient, ordering tests and/or medications, counseling the patient and coordination of care as outlined above.   Norleen IVAR Kidney, MD Department of Hematology/Oncology [facility] Phone: [phone] Pager: [phone] Email: [email or web address]  11/07/2023 11:10 PM

## 2023-11-08 ENCOUNTER — Inpatient Hospital Stay: Payer: BC Managed Care – PPO | Admitting: Hematology and Oncology

## 2023-11-08 ENCOUNTER — Inpatient Hospital Stay: Payer: BC Managed Care – PPO | Attending: Hematology and Oncology
